# Patient Record
Sex: Female | Born: 1980
Health system: Southern US, Community
[De-identification: ages and names within clinical notes are randomized; demographics above are authoritative.]

## PROBLEM LIST (undated history)

## (undated) DIAGNOSIS — Z8774 Personal history of (corrected) congenital malformations of heart and circulatory system: Secondary | ICD-10-CM

## (undated) DIAGNOSIS — K5900 Constipation, unspecified: Secondary | ICD-10-CM

## (undated) HISTORY — PX: APPENDECTOMY: SHX54

---

## 2012-07-14 ENCOUNTER — Encounter (HOSPITAL_BASED_OUTPATIENT_CLINIC_OR_DEPARTMENT_OTHER): Payer: Self-pay

## 2012-07-14 ENCOUNTER — Emergency Department (HOSPITAL_BASED_OUTPATIENT_CLINIC_OR_DEPARTMENT_OTHER)
Admission: EM | Admit: 2012-07-14 | Discharge: 2012-07-14 | Disposition: A | Discharge status: Home / Self Care | Payer: Self-pay | Attending: Emergency Medicine | Admitting: Emergency Medicine

## 2012-07-14 DIAGNOSIS — Y939 Activity, unspecified: Secondary | ICD-10-CM | POA: Insufficient documentation

## 2012-07-14 DIAGNOSIS — F17200 Nicotine dependence, unspecified, uncomplicated: Secondary | ICD-10-CM | POA: Insufficient documentation

## 2012-07-14 DIAGNOSIS — Y929 Unspecified place or not applicable: Secondary | ICD-10-CM | POA: Insufficient documentation

## 2012-07-14 DIAGNOSIS — S058X9A Other injuries of unspecified eye and orbit, initial encounter: Secondary | ICD-10-CM | POA: Insufficient documentation

## 2012-07-14 DIAGNOSIS — S0501XA Injury of conjunctiva and corneal abrasion without foreign body, right eye, initial encounter: Secondary | ICD-10-CM

## 2012-07-14 DIAGNOSIS — W64XXXA Exposure to other animate mechanical forces, initial encounter: Secondary | ICD-10-CM | POA: Insufficient documentation

## 2012-07-14 MED ORDER — TOBRAMYCIN 0.3 % OP SOLN
2.0000 [drp] | OPHTHALMIC | Status: DC
Start: 2012-07-14 — End: 2012-07-14
  Administered 2012-07-14: 2 [drp] via OPHTHALMIC
  Filled 2012-07-14: qty 5

## 2012-07-14 MED ORDER — ACETAMINOPHEN-CODEINE 120-12 MG/5ML PO SUSP: 10.0000 mL | Freq: Four times a day (QID) | ORAL | Status: DC | PRN

## 2012-07-14 MED ORDER — FLUORESCEIN SODIUM 1 MG OP STRP
ORAL_STRIP | OPHTHALMIC | Status: AC
  Administered 2012-07-14: 12:00:00
  Filled 2012-07-14: qty 1

## 2012-07-14 MED ORDER — TETRACAINE HCL 0.5 % OP SOLN
OPHTHALMIC | Status: AC
  Administered 2012-07-14: 12:00:00
  Filled 2012-07-14: qty 2

## 2012-07-14 NOTE — ED Notes (Signed)
Pt reports her dog scratch her right eye yesterday.  She now has swelling and pain in affected area.

## 2012-07-14 NOTE — ED Provider Notes (Signed)
History     CSN: [id number]  Arrival date & time 07/14/12  1054   First MD Initiated Contact with Patient 07/14/12 1202      Chief Complaint  Patient presents with  . Eye Injury    (Consider location/radiation/quality/duration/timing/severity/associated sxs/prior treatment) HPI Comments: Pt states that her dog scratched her right eye last night:pt denies any visual changes:pt states that she is just having redness and drainage and some discomfort:pt last tetanus WUJ:WJXB shots utd  Patient is a 32 y.o. female presenting with eye injury. The history is provided by the patient. No language interpreter was used.  Eye Injury This is a new problem. The current episode started yesterday. The problem occurs constantly. The problem has been unchanged. Pertinent negatives include no fever. Nothing aggravates the symptoms. She has tried nothing for the symptoms.    History reviewed. No pertinent past medical history.  Past Surgical History  Procedure Laterality Date  . Appendectomy      No family history on file.  History  Substance Use Topics  . Smoking status: Current Every Day Smoker -- 0.50 packs/day    Types: Cigarettes  . Smokeless tobacco: Not on file  . Alcohol Use: Yes     Comment: occasional    OB History   Grav Para Term Preterm Abortions TAB SAB Ect Mult Living                  Review of Systems  Constitutional: Negative for fever.  Respiratory: Negative.   Cardiovascular: Negative.     Allergies  Review of patient's allergies indicates no known allergies.  Home Medications  No current outpatient prescriptions on file.  BP 120/77  Pulse 59  Temp(Src) 97.9 F (36.6 C) (Oral)  Resp 16  Ht 5' (1.524 m)  Wt 130 lb (58.968 kg)  BMI 25.39 kg/m2  SpO2 100%  LMP 06/23/2012  Physical Exam  Nursing note and vitals reviewed. Constitutional: She is oriented to person, place, and time. She appears well-developed and well-nourished.  HENT:  Right Ear:  External ear normal.  Left Ear: External ear normal.  Eyes: EOM are normal. Pupils are equal, round, and reactive to light. Right conjunctiva is injected.  Slit lamp exam:      The right eye shows corneal abrasion and fluorescein uptake.    Abrasion without ulceration  Cardiovascular: Normal rate and regular rhythm.   Pulmonary/Chest: Effort normal and breath sounds normal.  Musculoskeletal: Normal range of motion.  Neurological: She is alert and oriented to person, place, and time.  Skin: Skin is warm and dry.    ED Course  Procedures (including critical care time)  Labs Reviewed - No data to display No results found.   1. Corneal abrasion, right, initial encounter       MDM  Discussed with pharmacist about good coverage:pt given tobra drops and something for pain and pt given optho referral        Teressa Lower, NP 07/14/12 1939

## 2012-07-15 NOTE — ED Provider Notes (Signed)
Medical screening examination/treatment/procedure(s) were performed by non-physician practitioner and as supervising physician I was immediately available for consultation/collaboration.   Gwyneth Sprout, MD 07/15/12 [phone]

## 2013-02-21 ENCOUNTER — Emergency Department (HOSPITAL_BASED_OUTPATIENT_CLINIC_OR_DEPARTMENT_OTHER)
Admission: EM | Admit: 2013-02-21 | Discharge: 2013-02-21 | Disposition: A | Discharge status: Home / Self Care | Payer: Self-pay | Attending: Emergency Medicine | Admitting: Emergency Medicine

## 2013-02-21 ENCOUNTER — Emergency Department (HOSPITAL_BASED_OUTPATIENT_CLINIC_OR_DEPARTMENT_OTHER): Payer: Self-pay

## 2013-02-21 ENCOUNTER — Encounter (HOSPITAL_BASED_OUTPATIENT_CLINIC_OR_DEPARTMENT_OTHER): Payer: Self-pay | Admitting: *Deleted

## 2013-02-21 DIAGNOSIS — R142 Eructation: Secondary | ICD-10-CM | POA: Insufficient documentation

## 2013-02-21 DIAGNOSIS — Z3202 Encounter for pregnancy test, result negative: Secondary | ICD-10-CM | POA: Insufficient documentation

## 2013-02-21 DIAGNOSIS — N9489 Other specified conditions associated with female genital organs and menstrual cycle: Secondary | ICD-10-CM | POA: Insufficient documentation

## 2013-02-21 DIAGNOSIS — K5900 Constipation, unspecified: Secondary | ICD-10-CM | POA: Insufficient documentation

## 2013-02-21 DIAGNOSIS — R141 Gas pain: Secondary | ICD-10-CM | POA: Insufficient documentation

## 2013-02-21 DIAGNOSIS — R197 Diarrhea, unspecified: Secondary | ICD-10-CM | POA: Insufficient documentation

## 2013-02-21 DIAGNOSIS — F17200 Nicotine dependence, unspecified, uncomplicated: Secondary | ICD-10-CM | POA: Insufficient documentation

## 2013-02-21 LAB — COMPREHENSIVE METABOLIC PANEL
ALT: 7 U/L (ref 0–35)
AST: 9 U/L (ref 0–37)
Albumin: 4.1 g/dL (ref 3.5–5.2)
Calcium: 9.3 mg/dL (ref 8.4–10.5)
Chloride: 104 mEq/L (ref 96–112)
Creatinine, Ser: 0.7 mg/dL (ref 0.50–1.10)
Potassium: 3.9 mEq/L (ref 3.5–5.1)

## 2013-02-21 LAB — CBC WITH DIFFERENTIAL/PLATELET
Eosinophils Absolute: 0.1 10*3/uL (ref 0.0–0.7)
Hemoglobin: 13.7 g/dL (ref 12.0–15.0)
Lymphs Abs: 1.9 10*3/uL (ref 0.7–4.0)
MCH: 30.4 pg (ref 26.0–34.0)
MCV: 87.6 fL (ref 78.0–100.0)
Monocytes Relative: 9 % (ref 3–12)
Neutrophils Relative %: 62 % (ref 43–77)
RBC: 4.5 MIL/uL (ref 3.87–5.11)

## 2013-02-21 LAB — URINALYSIS, ROUTINE W REFLEX MICROSCOPIC
Bilirubin Urine: NEGATIVE
Glucose, UA: NEGATIVE mg/dL
Ketones, ur: NEGATIVE mg/dL
pH: 7 (ref 5.0–8.0)

## 2013-02-21 MED ORDER — POLYETHYLENE GLYCOL 3350 17 GM/SCOOP PO POWD: 17.0000 g | Freq: Every day | ORAL | Status: AC

## 2013-02-21 NOTE — ED Notes (Signed)
Patient states she has a two week history of a change in her bowel habits.  States she has intermittent gone from constipation to soft stools.  For the last one week, she has had pain in the upper left quadrant of her abdomin and feels like her abdomin is firm.  States she feels full.  Denies nausea or vomiting.

## 2013-02-21 NOTE — ED Provider Notes (Signed)
CSN: [id number]     Arrival date & time 02/21/13  1203 History   First MD Initiated Contact with Patient 02/21/13 1237     Chief Complaint  Patient presents with  . Abdominal Pain   (Consider location/radiation/quality/duration/timing/severity/associated sxs/prior Treatment) HPI Comments: Danielle Potter is a 32 year old female who presents the emergency department with chief complaint of abdominal pain.  Over the past 3 months the patient has noticed a change in her stools.  She's had intermittent loose stools as well as constipation.  She's also noticed a sense of fullness in her belly and abdominal girth.  The patient has intermittently taken ex-lax to relieve her symptoms.  Over the past week she's noticed some constant pain in the left upper quadrant of her belly which is intermittent, colicky.  She is not made a bowel movement in the last 2 days.  Patient is also noted not taken anything to relieve the symptoms.  Her last menstrual period was the last week of September.  She has been having normal periods monthly.  Patient does not use any birth control because she was told that she has too much scarring to get pregnant from a previous appendectomy as a child.  The patient has been with in a monogamous sexual relationship with the same partner for the past 13 years.  Patient is a 31 y.o. female presenting with abdominal pain.  Abdominal Pain Associated symptoms: constipation and diarrhea   Associated symptoms: no chest pain, no chills, no dysuria, no fever, no hematuria, no nausea, no shortness of breath and no vomiting     History reviewed. No pertinent past medical history. Past Surgical History  Procedure Laterality Date  . Appendectomy     No family history on file. History  Substance Use Topics  . Smoking status: Current Every Day Smoker -- 0.50 packs/day    Types: Cigarettes  . Smokeless tobacco: Not on file  . Alcohol Use: Yes     Comment: occasional   OB History   Grav Para  Term Preterm Abortions TAB SAB Ect Mult Living                 Review of Systems  Constitutional: Negative for fever and chills.  HENT: Negative for trouble swallowing.   Respiratory: Negative for shortness of breath.   Cardiovascular: Negative for chest pain.  Gastrointestinal: Positive for abdominal pain, diarrhea, constipation and abdominal distention. Negative for nausea, vomiting and blood in stool.  Genitourinary: Negative for dysuria and hematuria.  Musculoskeletal: Negative for myalgias and arthralgias.  Skin: Negative for rash.  Neurological: Negative for numbness.  All other systems reviewed and are negative.    Allergies  Review of patient's allergies indicates no known allergies.  Home Medications   Current Outpatient Rx  Name  Route  Sig  Dispense  Refill  . acetaminophen-codeine 120-12 MG/5ML suspension   Oral   Take 10 mLs by mouth every 6 (six) hours as needed for pain.   100 mL   0    BP 140/68  Pulse 72  Temp(Src) 97.9 F (36.6 C) (Oral)  Resp 20  SpO2 100%  LMP 02/12/2013 Physical Exam  Constitutional: She is oriented to person, place, and time. She appears well-developed and well-nourished. No distress.  HENT:  Head: Normocephalic and atraumatic.  Eyes: Conjunctivae are normal. No scleral icterus.  Neck: Normal range of motion.  Cardiovascular: Normal rate, regular rhythm and normal heart sounds.  Exam reveals no gallop and no friction rub.  No murmur heard. Pulmonary/Chest: Effort normal and breath sounds normal. No respiratory distress.  Abdominal: Soft. Bowel sounds are normal. She exhibits mass (palpable, firm abdominal mass extending from the suprapubic region to the umbilicus). She exhibits no distension. There is tenderness (LUQ. Suprapubic). There is no guarding.  Neurological: She is alert and oriented to person, place, and time.  Skin: Skin is warm and dry. She is not diaphoretic.    ED Course  Procedures (including critical care  time) Labs Review Labs Reviewed  URINALYSIS, ROUTINE W REFLEX MICROSCOPIC  PREGNANCY, URINE  CBC WITH DIFFERENTIAL  COMPREHENSIVE METABOLIC PANEL  LIPASE, BLOOD  CA 125   Imaging Review US Transvaginal Non-ob  02/21/2013   CLINICAL DATA:  Left lower quadrant pain. Constipation and bloating.  EXAM: TRANSABDOMINAL AND TRANSVAGINAL ULTRASOUND OF PELVIS  TECHNIQUE: Both transabdominal and transvaginal ultrasound examinations of the pelvis were performed. Transabdominal technique was performed for global imaging of the pelvis including uterus, ovaries, adnexal regions, and pelvic cul-de-sac. It was necessary to proceed with endovaginal exam following the transabdominal exam to visualize the uterus, endometrium, ovaries, and adnexa.  COMPARISON:  None  FINDINGS: Uterus  Measurements: 8.9 x 3.7 x 3.4 cm. There is a 3.1 cm exophytic lesion at the time of the uterine fundus, most likely representing an exophytic fibroid.  Endometrium  Thickness: Normal. 14 mm in thickness. No focal abnormality visualized.  Right ovary  Measurements: 4.5 x 2.9 x 2.9 cm. Two simple appearing cysts on the right ovary, each measuring 2.7 cm in size.  Left ovary  Measurements: Not visualized. There is a large cystic mass in the left adnexa with internal septations as well as solid components, measuring 12.7 x 13.1 x 9.6 cm. The complex solid component measures 5.6 x 6.3 x 3.7 cm.  Other findings  Small amount of free fluid.  IMPRESSION: Large complex mixed solid and cystic left adnexal mass measuring 13.1 cm maximum diameter. 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  In   Electronically Signed   By: Geanie Cooley M.D.   On: 02/21/2013 15:44   US Pelvis Complete  02/21/2013   CLINICAL DATA:  Left lower quadrant pain. Constipation and bloating.  EXAM: TRANSABDOMINAL AND TRANSVAGINAL ULTRASOUND OF PELVIS  TECHNIQUE: Both transabdominal and transvaginal ultrasound examinations of the pelvis were performed. Transabdominal  technique was performed for global imaging of the pelvis including uterus, ovaries, adnexal regions, and pelvic cul-de-sac. It was necessary to proceed with endovaginal exam following the transabdominal exam to visualize the uterus, endometrium, ovaries, and adnexa.  COMPARISON:  None  FINDINGS: Uterus  Measurements: 8.9 x 3.7 x 3.4 cm. There is a 3.1 cm exophytic lesion at the time of the uterine fundus, most likely representing an exophytic fibroid.  Endometrium  Thickness: Normal. 14 mm in thickness. No focal abnormality visualized.  Right ovary  Measurements: 4.5 x 2.9 x 2.9 cm. Two simple appearing cysts on the right ovary, each measuring 2.7 cm in size.  Left ovary  Measurements: Not visualized. There is a large cystic mass in the left adnexa with internal septations as well as solid components, measuring 12.7 x 13.1 x 9.6 cm. The complex solid component measures 5.6 x 6.3 x 3.7 cm.  Other findings  Small amount of free fluid.  IMPRESSION: Large complex mixed solid and cystic left adnexal mass measuring 13.1 cm maximum diameter. 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  In   Electronically Signed   By: Geanie Cooley M.D.   On: 02/21/2013  15:44   Dg Abd Acute W/chest  02/21/2013   CLINICAL DATA:  Lower abdominal pain and cramping.  EXAM: ACUTE ABDOMEN SERIES (ABDOMEN 2 VIEW & CHEST 1 VIEW)  COMPARISON:  None.  FINDINGS: There is no evidence of dilated bowel loops or free intraperitoneal air. There is a tiny calcification in the left side of the pelvis which is most likely a phlebolith. Osseous structures are normal. Heart size and mediastinal contours are within normal limits. Both lungs are clear.  IMPRESSION: Negative abdominal radiographs.  No acute cardiopulmonary disease.   Electronically Signed   By: Geanie Cooley M.D.   On: 02/21/2013 13:43    MDM   1. Adnexal mass   2. Constipation    Patient with abdominal pain  And stool changes. Mass in the abdomen is consistent with Uterine fundus.  Suspect pregnancy. Will evaluate with labs.  I have canceled the imaging pending  preg test.   Patient labs without acute abnormality, however I am concerned with the mass in her abdomen and have ordered transvaginal US.  4:03 PM Patient with 13 cm left adnexal mass concerning for ovarian cancer. i have spoken with Dr. Scheryl Darter at [facility] who will ensure patient has close follow up.  Will draw CA 125  Here. Discharge with Miralax. Discussed the findings with the patient. I answered all questions to the best of my ability. The patient expresses her understanding and agrees with the plan of care. The patient appears reasonably screened and/or stabilized for discharge and I doubt any other medical condition or other [facility] requiring further screening, evaluation, or treatment in the ED at this time prior to discharge.    Arthor Captain, PA-C 02/21/13 2229

## 2013-02-22 NOTE — ED Provider Notes (Signed)
Medical screening examination/treatment/procedure(s) were performed by non-physician practitioner and as supervising physician I was immediately available for consultation/collaboration.  Teiara Baria, MD 02/22/13 1444 

## 2013-02-23 LAB — CA 125: CA 125: 10.5 U/mL (ref 0.0–30.2)

## 2013-03-30 ENCOUNTER — Encounter: Payer: Self-pay | Admitting: Obstetrics & Gynecology

## 2013-03-30 ENCOUNTER — Ambulatory Visit (INDEPENDENT_AMBULATORY_CARE_PROVIDER_SITE_OTHER): Payer: Self-pay | Admitting: Obstetrics & Gynecology

## 2013-03-30 VITALS — BP 117/76 | HR 74 | Ht 60.0 in | Wt 148.9 lb

## 2013-03-30 DIAGNOSIS — N83209 Unspecified ovarian cyst, unspecified side: Secondary | ICD-10-CM

## 2013-03-30 DIAGNOSIS — N83202 Unspecified ovarian cyst, left side: Secondary | ICD-10-CM

## 2013-03-30 NOTE — Progress Notes (Signed)
Subjective:     Patient ID: Danielle Potter, female   DOB: Jan 02, 1981, 32 y.o.   MRN: [id number]  HPI Pt is a 32 yo G0 who presents as f/u from the ED. She presents with c/o 2 months of pelvic pain.  When she went to the ED it had become severe.  The pain is intermittent in intensity.  She has been able to do work.  She denies weight loss.  Pt reports that her tubes are blocked so the is infertile and she is ok with that.   History reviewed. No pertinent past medical history.  Past Surgical History  Procedure Laterality Date  . Appendectomy     No Known Allergies  Current Outpatient Prescriptions on File Prior to Visit  Medication Sig Dispense Refill  . polyethylene glycol powder (GLYCOLAX/MIRALAX) powder Take 17 g by mouth daily.  255 g  0  . acetaminophen-codeine 120-12 MG/5ML suspension Take 10 mLs by mouth every 6 (six) hours as needed for pain.  100 mL  0   No current facility-administered medications on file prior to visit.   History   Social History  . Marital Status: Single    Spouse Name: N/A    Number of Children: N/A  . Years of Education: N/A   Occupational History  . Not on file.   Social History Main Topics  . Smoking status: Current Every Day Smoker -- 0.50 packs/day    Types: Cigarettes  . Smokeless tobacco: Not on file  . Alcohol Use: Yes     Comment: occasional  . Drug Use: No  . Sexual Activity: Not on file   Other Topics Concern  . Not on file   Social History Narrative  . No narrative on file    Review of Systems     Objective:   Physical Exam BP 117/76  Pulse 74  Ht 5' (1.524 m)  Wt 148 lb 14.4 oz (67.541 kg)  BMI 29.08 kg/m2  LMP 03/29/2013 Pt in NAD Lungs: CTA Abd: soft, NT, ND  GU: EGBUS: no lesions Vagina: no blood in vault Cervix: no lesion; no mucopurulent d/c Uterus: small, mobile Adnexa: large mass- mobile; feels 12cm; tender to palpation        02/21/2013 EXAM:  TRANSABDOMINAL AND TRANSVAGINAL ULTRASOUND OF PELVIS   TECHNIQUE:  Both transabdominal and transvaginal ultrasound examinations of the  pelvis were performed. Transabdominal technique was performed for  global imaging of the pelvis including uterus, ovaries, adnexal  regions, and pelvic cul-de-sac. It was necessary to proceed with  endovaginal exam following the transabdominal exam to visualize the  uterus, endometrium, ovaries, and adnexa.  COMPARISON: None  FINDINGS:  Uterus  Measurements: 8.9 x 3.7 x 3.4 cm. There is a 3.1 cm exophytic lesion  at the time of the uterine fundus, most likely representing an  exophytic fibroid.  Endometrium  Thickness: Normal. 14 mm in thickness. No focal abnormality  visualized.  Right ovary  Measurements: 4.5 x 2.9 x 2.9 cm. Two simple appearing cysts on the  right ovary, each measuring 2.7 cm in size.  Left ovary  Measurements: Not visualized. There is a large cystic mass in the  left adnexa with internal septations as well as solid components,  measuring 12.7 x 13.1 x 9.6 cm. The complex solid component measures  5.6 x 6.3 x 3.7 cm.  Other findings  Small amount of free fluid.  IMPRESSION:  Large complex mixed solid and cystic left adnexal mass measuring  13.1 cm  maximum diameter. 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      Assessment:     Mass on left adnexa- d/w pt the need for surgical evaluation.  Will get CA125 and CT. WiIl have pt f/u in 1-2 weeks to determine the type of surgery      Uterine fibroids- asymptomatic    Plan:     Pelvic CT- with and without contrast F/u 2 weeks CA125

## 2013-03-30 NOTE — Patient Instructions (Signed)
Ovarian Cyst  The ovaries are small organs that are on each side of the uterus. The ovaries are the organs that produce the female hormones, estrogen and progesterone. An ovarian cyst is a sac filled with fluid that can vary in its size. It is normal for a small cyst to form in women who are in the childbearing age and who have menstrual periods. This type of cyst is called a follicle cyst that becomes an ovulation cyst (corpus luteum cyst) after it produces the women's egg. It later goes away on its own if the woman does not become pregnant. There are other kinds of ovarian cysts that may cause problems and may need to be treated. The most serious problem is a cyst with cancer. It should be noted that menopausal women who have an ovarian cyst are at a higher risk of it being a cancer cyst. They should be evaluated very quickly, thoroughly and followed closely. This is especially true in menopausal women because of the high rate of ovarian cancer in women in menopause.  CAUSES AND TYPES OF OVARIAN CYSTS:   FUNCTIONAL CYST: The follicle/corpus luteum cyst is a functional cyst that occurs every month during ovulation with the menstrual cycle. They go away with the next menstrual cycle if the woman does not get pregnant. Usually, there are no symptoms with a functional cyst.   ENDOMETRIOMA CYST: This cyst develops from the lining of the uterus tissue. This cyst gets in or on the ovary. It grows every month from the bleeding during the menstrual period. It is also called a "chocolate cyst" because it becomes filled with blood that turns brown. This cyst can cause pain in the lower abdomen during intercourse and with your menstrual period.   CYSTADENOMA CYST: This cyst develops from the cells on the outside of the ovary. They usually are not cancerous. They can get very big and cause lower abdomen pain and pain with intercourse. This type of cyst can twist on itself, cut off its blood supply and cause severe pain. It  also can easily rupture and cause a lot of pain.   DERMOID CYST: This type of cyst is sometimes found in both ovaries. They are found to have different kinds of body tissue in the cyst. The tissue includes skin, teeth, hair, and/or cartilage. They usually do not have symptoms unless they get very big. Dermoid cysts are rarely cancerous.   POLYCYSTIC OVARY: This is a rare condition with hormone problems that produces many small cysts on both ovaries. The cysts are follicle-like cysts that never produce an egg and become a corpus luteum. It can cause an increase in body weight, infertility, acne, increase in body and facial hair and lack of menstrual periods or rare menstrual periods. Many women with this problem develop type 2 diabetes. The exact cause of this problem is unknown. A polycystic ovary is rarely cancerous.   THECA LUTEIN CYST: Occurs when too much hormone (human chorionic gonadotropin) is produced and over-stimulates the ovaries to produce an egg. They are frequently seen when doctors stimulate the ovaries for invitro-fertilization (test tube babies).   LUTEOMA CYST: This cyst is seen during pregnancy. Rarely it can cause an obstruction to the birth canal during labor and delivery. They usually go away after delivery.  SYMPTOMS    Pelvic pain or pressure.   Pain during sexual intercourse.   Increasing girth (swelling) of the abdomen.   Abnormal menstrual periods.   Increasing pain with menstrual periods.     You stop having menstrual periods and you are not pregnant.  DIAGNOSIS   The diagnosis can be made during:   Routine or annual pelvic examination (common).   Ultrasound.   X-ray of the pelvis.   CT Scan.   MRI.   Blood tests.  TREATMENT    Treatment may only be to follow the cyst monthly for 2 to 3 months with your caregiver. Many go away on their own, especially functional cysts.   May be aspirated (drained) with a long needle with ultrasound, or by laparoscopy (inserting a tube into  the pelvis through a small incision).   The whole cyst can be removed by laparoscopy.   Sometimes the cyst may need to be removed through an incision in the lower abdomen.   Hormone treatment is sometimes used to help dissolve certain cysts.   Birth control pills are sometimes used to help dissolve certain cysts.  HOME CARE INSTRUCTIONS   Follow your caregiver's advice regarding:   Medicine.   Follow up visits to evaluate and treat the cyst.   You may need to come back or make an appointment with another caregiver, to find the exact cause of your cyst, if your caregiver is not a gynecologist.   Get your yearly and recommended pelvic examinations and Pap tests.   Let your caregiver know if you have had an ovarian cyst in the past.  SEEK MEDICAL CARE IF:    Your periods are late, irregular, they stop, or are painful.   Your stomach (abdomen) or pelvic pain does not go away.   Your stomach becomes larger or swollen.   You have pressure on your bladder or trouble emptying your bladder completely.   You have painful sexual intercourse.   You have feelings of fullness, pressure, or discomfort in your stomach.   You lose weight for no apparent reason.   You feel generally ill.   You become constipated.   You lose your appetite.   You develop acne.   You have an increase in body and facial hair.   You are gaining weight, without changing your exercise and eating habits.   You think you are pregnant.  SEEK IMMEDIATE MEDICAL CARE IF:    You have increasing abdominal pain.   You feel sick to your stomach (nausea) and/or vomit.   You develop a fever that comes on suddenly.   You develop abdominal pain during a bowel movement.   Your menstrual periods become heavier than usual.  Document Released: 05/04/2005 Document Revised: 07/27/2011 Document Reviewed: 03/07/2009  [employer] Patient Information 2014 [employer].

## 2013-03-31 ENCOUNTER — Ambulatory Visit (HOSPITAL_COMMUNITY)
Admission: RE | Admit: 2013-03-31 | Discharge: 2013-03-31 | Disposition: A | Discharge status: Home / Self Care | Payer: Self-pay | Source: Ambulatory Visit | Attending: Obstetrics & Gynecology | Admitting: Obstetrics & Gynecology

## 2013-03-31 ENCOUNTER — Telehealth: Payer: Self-pay | Admitting: Obstetrics & Gynecology

## 2013-03-31 ENCOUNTER — Other Ambulatory Visit: Payer: Self-pay | Admitting: Obstetrics & Gynecology

## 2013-03-31 DIAGNOSIS — N9489 Other specified conditions associated with female genital organs and menstrual cycle: Secondary | ICD-10-CM | POA: Insufficient documentation

## 2013-03-31 DIAGNOSIS — N854 Malposition of uterus: Secondary | ICD-10-CM | POA: Insufficient documentation

## 2013-03-31 DIAGNOSIS — N83202 Unspecified ovarian cyst, left side: Secondary | ICD-10-CM

## 2013-03-31 DIAGNOSIS — N83209 Unspecified ovarian cyst, unspecified side: Secondary | ICD-10-CM | POA: Insufficient documentation

## 2013-03-31 LAB — CA 125: CA 125: 7.9 U/mL (ref 0.0–30.2)

## 2013-03-31 MED ORDER — IOHEXOL 300 MG/ML  SOLN
100.0000 mL | Freq: Once | INTRAMUSCULAR | Status: AC | PRN
  Administered 2013-03-31: 100 mL via INTRAVENOUS

## 2013-03-31 NOTE — Telephone Encounter (Signed)
Pt called to discuss her CA125 and her CT results.  She has opted for a LSO via laparotomy with removal of the pedunculated fibroid.  Will post the case. Pt has appt early Dec will review poreop instructions at that time.  Bob Eastwood L. Harraway-Smith, M.D., [place]

## 2013-04-19 ENCOUNTER — Encounter: Payer: Self-pay | Admitting: Obstetrics & Gynecology

## 2013-04-19 ENCOUNTER — Ambulatory Visit (INDEPENDENT_AMBULATORY_CARE_PROVIDER_SITE_OTHER): Payer: Self-pay | Admitting: Obstetrics & Gynecology

## 2013-04-19 VITALS — BP 123/80 | HR 83 | Temp 97.0°F | Ht 60.0 in | Wt 154.4 lb

## 2013-04-19 DIAGNOSIS — N83209 Unspecified ovarian cyst, unspecified side: Secondary | ICD-10-CM

## 2013-04-19 NOTE — Patient Instructions (Signed)
Laparotomy °Care After °Refer to this sheet in the next few weeks. These instructions provide you with information on caring for yourself after your procedure. Your caregiver may also give you more specific instructions. Your treatment has been planned according to current medical practices, but problems sometimes occur. Call your caregiver if you have any problems or questions after your procedure. °HOME CARE INSTRUCTIONS °ACTIVITY °· Rest as much as possible the first two weeks at home. °· Avoid strenuous activity such as heavy lifting (more than 10 pounds), pushing, or pulling. Limit stair climbing to once or twice a day for the first week, then slowly increase this activity. °· Take frequent rest periods throughout the day. °· Talk with your caregiver about when you may resume your usual physical activity. °· You need to be out of bed and walking as much as possible. This decreases the chance of: °· Blood clots. °· Pneumonia. °NUTRITION °· You can resume your normal diet once you regain bowel function. °· Drink plenty of fluids (6 8 glasses a day or as instructed by your caregiver). °· Eat a well-balanced diet. °· Daily portions of food from the meat (protein), milk, vegetable, and bread groups are necessary for your health. °ELIMINATION °It is very important not to strain during bowel movements. If constipation should occur, you may: °· Take a mild laxative. °· Add fruit and bran to your diet. °· Drink more fluids. °HYGIENE °· Take showers, not baths, until 4 6 weeks after surgery. °· If your incision is closed, you may take a shower or tub bath. °FEVER °If you feel feverish or have shaking chills, take your temperature. If it is 102° F (38.9° C), call your caregiver. The fever may mean there is an infection. °PAIN CONTROL °· Mild discomfort may occur. °· Only take over-the-counter or prescription medicines for pain, discomfort, or fever as directed by your caregiver. Take any prescribed medicines exactly as  directed. °INCISION CARE °· Keep your incision site clean with soap and water. °· Do not use a dressing unless your cut (incision) from surgery is draining or irritated. °· If you have small adhesive strips in place and they do not fall off within 10 days, carefully peel them off. °· Check your incision and surrounding area daily for any redness, swelling, discoloration, heavy drainage, or separation of the skin. °SEXUAL INTERCOURSE °Do not have sexual intercourse until after your follow-up appointment, unless your caregiver tells you otherwise. °SEEK MEDICAL CARE IF:  °· You are unable to tolerate food or drinks. °· You are unable to pass gas or have a bowel movement. °· Your pain becomes more severe or is not relieved with medicines. °· You have redness, swelling, discoloration, heavy drainage, or separation of the skin at the incision site. °Document Released: 12/17/2003 Document Revised: 04/20/2012 Document Reviewed: 05/03/2007 °ExitCare® Patient Information ©2014 [employer]. ° °

## 2013-04-19 NOTE — Progress Notes (Signed)
Patient ID: Danielle Potter, female   DOB: 11-Mar-1981, 32 y.o.   MRN: [id number] Patient desires surgical management with laparotomy with left oophorectomy and myomectomy.  The risks of surgery were discussed in detail with the patient including but not limited to: bleeding which may require transfusion or reoperation; infection which may require prolonged hospitalization or re-hospitalization and antibiotic therapy; injury to bowel, bladder, ureters and major vessels or other surrounding organs; need for additional procedures including laparotomy; thromboembolic phenomenon, incisional problems and other postoperative or anesthesia complications.  Patient was told that the likelihood that her condition and symptoms will be treated effectively with this surgical management was very high; the postoperative expectations were also discussed in detail. The patient also understands the alternative treatment options which were discussed in full. All questions were answered.  Routine preoperative instructions of having nothing to eat or drink after midnight on the day prior to surgery and also coming to the hospital 1 1/2 hours prior to her time of surgery were also emphasized.  She was told she will be called for a preoperative appointment about a week prior to surgery and will be given further preoperative instructions at that visit. Printed patient education handouts about the procedure were given to the patient to review at home.

## 2013-05-04 ENCOUNTER — Encounter (HOSPITAL_COMMUNITY): Payer: Self-pay

## 2013-05-16 ENCOUNTER — Inpatient Hospital Stay (HOSPITAL_COMMUNITY): Payer: Self-pay | Admitting: Anesthesiology

## 2013-05-16 ENCOUNTER — Encounter (HOSPITAL_COMMUNITY): Payer: Self-pay | Admitting: Anesthesiology

## 2013-05-16 ENCOUNTER — Inpatient Hospital Stay (HOSPITAL_COMMUNITY)
Admission: RE | Admit: 2013-05-16 | Discharge: 2013-05-17 | DRG: 743 | Disposition: A | Discharge status: Home / Self Care | Payer: Self-pay | Source: Ambulatory Visit | Attending: Obstetrics & Gynecology | Admitting: Obstetrics & Gynecology

## 2013-05-16 ENCOUNTER — Encounter (HOSPITAL_COMMUNITY): Payer: Self-pay | Admitting: *Deleted

## 2013-05-16 ENCOUNTER — Encounter (HOSPITAL_COMMUNITY)
Admission: RE | Disposition: A | Discharge status: Home / Self Care | Payer: Self-pay | Source: Ambulatory Visit | Attending: Obstetrics & Gynecology

## 2013-05-16 DIAGNOSIS — N854 Malposition of uterus: Secondary | ICD-10-CM | POA: Diagnosis present

## 2013-05-16 DIAGNOSIS — N736 Female pelvic peritoneal adhesions (postinfective): Secondary | ICD-10-CM

## 2013-05-16 DIAGNOSIS — R102 Pelvic and perineal pain: Secondary | ICD-10-CM

## 2013-05-16 DIAGNOSIS — N83209 Unspecified ovarian cyst, unspecified side: Principal | ICD-10-CM

## 2013-05-16 DIAGNOSIS — F17200 Nicotine dependence, unspecified, uncomplicated: Secondary | ICD-10-CM | POA: Diagnosis present

## 2013-05-16 DIAGNOSIS — N949 Unspecified condition associated with female genital organs and menstrual cycle: Secondary | ICD-10-CM | POA: Diagnosis present

## 2013-05-16 DIAGNOSIS — Z9889 Other specified postprocedural states: Secondary | ICD-10-CM

## 2013-05-16 HISTORY — PX: SALPINGOOPHORECTOMY: SHX82

## 2013-05-16 HISTORY — PX: LAPAROTOMY: SHX154

## 2013-05-16 HISTORY — DX: Personal history of (corrected) congenital malformations of heart and circulatory system: Z87.74

## 2013-05-16 HISTORY — DX: Constipation, unspecified: K59.00

## 2013-05-16 LAB — ABO/RH: ABO/RH(D): A POS

## 2013-05-16 LAB — PREGNANCY, URINE: Preg Test, Ur: NEGATIVE

## 2013-05-16 LAB — CBC
HCT: 39.1 % (ref 36.0–46.0)
Hemoglobin: 13.6 g/dL (ref 12.0–15.0)
MCH: 30.2 pg (ref 26.0–34.0)
MCV: 86.7 fL (ref 78.0–100.0)
Platelets: 253 10*3/uL (ref 150–400)
RBC: 4.51 MIL/uL (ref 3.87–5.11)
WBC: 8.2 10*3/uL (ref 4.0–10.5)

## 2013-05-16 LAB — TYPE AND SCREEN

## 2013-05-16 SURGERY — SALPINGO-OOPHORECTOMY, OPEN
Anesthesia: General | Site: Abdomen

## 2013-05-16 MED ORDER — DEXTROSE-NACL 5-0.45 % IV SOLN
INTRAVENOUS | Status: DC
  Administered 2013-05-16: 21:00:00 via INTRAVENOUS

## 2013-05-16 MED ORDER — HYDROMORPHONE HCL PF 1 MG/ML IJ SOLN
0.2500 mg | INTRAMUSCULAR | Status: DC | PRN
  Administered 2013-05-16 (×2): 0.5 mg via INTRAVENOUS

## 2013-05-16 MED ORDER — PROPOFOL 10 MG/ML IV BOLUS
INTRAVENOUS | Status: DC | PRN
  Administered 2013-05-16: 200 mg via INTRAVENOUS

## 2013-05-16 MED ORDER — SODIUM CHLORIDE 0.9 % IJ SOLN: 9.0000 mL | INTRAMUSCULAR | Status: DC | PRN

## 2013-05-16 MED ORDER — ONDANSETRON HCL 4 MG/2ML IJ SOLN
INTRAMUSCULAR | Status: AC
  Filled 2013-05-16: qty 2

## 2013-05-16 MED ORDER — ROCURONIUM BROMIDE 100 MG/10ML IV SOLN
INTRAVENOUS | Status: AC
  Filled 2013-05-16: qty 1

## 2013-05-16 MED ORDER — KETOROLAC TROMETHAMINE 30 MG/ML IJ SOLN
30.0000 mg | Freq: Once | INTRAMUSCULAR | Status: AC
  Administered 2013-05-16: 30 mg via INTRAVENOUS

## 2013-05-16 MED ORDER — DIPHENHYDRAMINE HCL 50 MG/ML IJ SOLN: 12.5000 mg | Freq: Four times a day (QID) | INTRAMUSCULAR | Status: DC | PRN

## 2013-05-16 MED ORDER — NEOSTIGMINE METHYLSULFATE 1 MG/ML IJ SOLN
INTRAMUSCULAR | Status: DC | PRN
  Administered 2013-05-16: 3 mg via INTRAVENOUS

## 2013-05-16 MED ORDER — HYDROMORPHONE HCL PF 1 MG/ML IJ SOLN
INTRAMUSCULAR | Status: AC
  Filled 2013-05-16: qty 1

## 2013-05-16 MED ORDER — ACETAMINOPHEN 160 MG/5ML PO SOLN
ORAL | Status: AC
  Administered 2013-05-16: 975 mg via ORAL
  Filled 2013-05-16: qty 20.3

## 2013-05-16 MED ORDER — FENTANYL CITRATE 0.05 MG/ML IJ SOLN
INTRAMUSCULAR | Status: DC | PRN
  Administered 2013-05-16: 100 ug via INTRAVENOUS
  Administered 2013-05-16: 150 ug via INTRAVENOUS

## 2013-05-16 MED ORDER — OXYCODONE-ACETAMINOPHEN 5-325 MG PO TABS: 1.0000 | ORAL_TABLET | ORAL | Status: DC | PRN

## 2013-05-16 MED ORDER — CEFAZOLIN SODIUM-DEXTROSE 2-3 GM-% IV SOLR: 2.0000 g | INTRAVENOUS | Status: DC

## 2013-05-16 MED ORDER — LIDOCAINE HCL (CARDIAC) 20 MG/ML IV SOLN
INTRAVENOUS | Status: AC
  Filled 2013-05-16: qty 5

## 2013-05-16 MED ORDER — MIDAZOLAM HCL 2 MG/2ML IJ SOLN
INTRAMUSCULAR | Status: DC | PRN
  Administered 2013-05-16: 2 mg via INTRAVENOUS

## 2013-05-16 MED ORDER — SIMETHICONE 80 MG PO CHEW: 80.0000 mg | CHEWABLE_TABLET | Freq: Four times a day (QID) | ORAL | Status: DC | PRN

## 2013-05-16 MED ORDER — BUPIVACAINE HCL (PF) 0.25 % IJ SOLN
INTRAMUSCULAR | Status: AC
  Filled 2013-05-16: qty 30

## 2013-05-16 MED ORDER — LACTATED RINGERS IV SOLN
INTRAVENOUS | Status: DC
  Administered 2013-05-16: 14:00:00 via INTRAVENOUS

## 2013-05-16 MED ORDER — PROPOFOL 10 MG/ML IV EMUL
INTRAVENOUS | Status: AC
  Filled 2013-05-16: qty 20

## 2013-05-16 MED ORDER — MICROFIBRILLAR COLL HEMOSTAT EX POWD
CUTANEOUS | Status: AC
  Filled 2013-05-16: qty 5

## 2013-05-16 MED ORDER — KETOROLAC TROMETHAMINE 30 MG/ML IJ SOLN: 30.0000 mg | Freq: Four times a day (QID) | INTRAMUSCULAR | Status: DC

## 2013-05-16 MED ORDER — LACTATED RINGERS IV SOLN: INTRAVENOUS | Status: DC

## 2013-05-16 MED ORDER — HYDROMORPHONE 0.3 MG/ML IV SOLN
INTRAVENOUS | Status: DC
  Administered 2013-05-16: 0.2 mg via INTRAVENOUS
  Administered 2013-05-16: 19:00:00 via INTRAVENOUS
  Administered 2013-05-17: 0.339 mg via INTRAVENOUS
  Administered 2013-05-17: 0.4 mg via INTRAVENOUS
  Filled 2013-05-16: qty 25

## 2013-05-16 MED ORDER — LACTATED RINGERS IV SOLN
INTRAVENOUS | Status: DC
  Administered 2013-05-16 (×2): via INTRAVENOUS

## 2013-05-16 MED ORDER — GLYCOPYRROLATE 0.2 MG/ML IJ SOLN
INTRAMUSCULAR | Status: DC | PRN
  Administered 2013-05-16: 0.6 mg via INTRAVENOUS

## 2013-05-16 MED ORDER — SODIUM CHLORIDE 0.9 % IJ SOLN
INTRAMUSCULAR | Status: AC
  Filled 2013-05-16: qty 50

## 2013-05-16 MED ORDER — ONDANSETRON HCL 4 MG/2ML IJ SOLN
4.0000 mg | Freq: Four times a day (QID) | INTRAMUSCULAR | Status: DC | PRN
  Filled 2013-05-16: qty 2

## 2013-05-16 MED ORDER — KETOROLAC TROMETHAMINE 30 MG/ML IJ SOLN
INTRAMUSCULAR | Status: AC
  Filled 2013-05-16: qty 1

## 2013-05-16 MED ORDER — DIPHENHYDRAMINE HCL 12.5 MG/5ML PO ELIX: 12.5000 mg | ORAL_SOLUTION | Freq: Four times a day (QID) | ORAL | Status: DC | PRN

## 2013-05-16 MED ORDER — LIDOCAINE HCL (CARDIAC) 20 MG/ML IV SOLN
INTRAVENOUS | Status: DC | PRN
  Administered 2013-05-16: 50 mg via INTRAVENOUS

## 2013-05-16 MED ORDER — ONDANSETRON HCL 4 MG/2ML IJ SOLN
4.0000 mg | Freq: Four times a day (QID) | INTRAMUSCULAR | Status: DC | PRN
  Administered 2013-05-16: 4 mg via INTRAVENOUS

## 2013-05-16 MED ORDER — NALOXONE HCL 0.4 MG/ML IJ SOLN: 0.4000 mg | INTRAMUSCULAR | Status: DC | PRN

## 2013-05-16 MED ORDER — ROCURONIUM BROMIDE 100 MG/10ML IV SOLN
INTRAVENOUS | Status: DC | PRN
  Administered 2013-05-16: 50 mg via INTRAVENOUS

## 2013-05-16 MED ORDER — SODIUM CHLORIDE 0.9 % IJ SOLN
INTRAMUSCULAR | Status: AC
  Filled 2013-05-16: qty 10

## 2013-05-16 MED ORDER — HYDROMORPHONE HCL PF 1 MG/ML IJ SOLN
INTRAMUSCULAR | Status: DC | PRN
  Administered 2013-05-16: 1 mg via INTRAVENOUS

## 2013-05-16 MED ORDER — IBUPROFEN 600 MG PO TABS: 600.0000 mg | ORAL_TABLET | Freq: Four times a day (QID) | ORAL | Status: DC | PRN

## 2013-05-16 MED ORDER — CEFAZOLIN SODIUM-DEXTROSE 2-3 GM-% IV SOLR
2.0000 g | INTRAVENOUS | Status: AC
  Administered 2013-05-16: 2 g via INTRAVENOUS

## 2013-05-16 MED ORDER — BUPIVACAINE LIPOSOME 1.3 % IJ SUSP
20.0000 mL | Freq: Once | INTRAMUSCULAR | Status: AC
Start: 2013-05-16 — End: 2013-05-16
  Administered 2013-05-16: 20 mL
  Filled 2013-05-16: qty 20

## 2013-05-16 MED ORDER — MIDAZOLAM HCL 2 MG/2ML IJ SOLN
INTRAMUSCULAR | Status: AC
  Filled 2013-05-16: qty 2

## 2013-05-16 MED ORDER — ZOLPIDEM TARTRATE 5 MG PO TABS: 5.0000 mg | ORAL_TABLET | Freq: Every evening | ORAL | Status: DC | PRN

## 2013-05-16 MED ORDER — PANTOPRAZOLE SODIUM 40 MG IV SOLR
40.0000 mg | Freq: Every day | INTRAVENOUS | Status: DC
  Administered 2013-05-16: 40 mg via INTRAVENOUS
  Filled 2013-05-16 (×2): qty 40

## 2013-05-16 MED ORDER — ONDANSETRON HCL 4 MG/2ML IJ SOLN
INTRAMUSCULAR | Status: DC | PRN
  Administered 2013-05-16: 4 mg via INTRAVENOUS

## 2013-05-16 MED ORDER — KETOROLAC TROMETHAMINE 30 MG/ML IJ SOLN: 15.0000 mg | Freq: Once | INTRAMUSCULAR | Status: DC | PRN

## 2013-05-16 MED ORDER — MEPERIDINE HCL 25 MG/ML IJ SOLN: 6.2500 mg | INTRAMUSCULAR | Status: DC | PRN

## 2013-05-16 MED ORDER — ACETAMINOPHEN 160 MG/5ML PO SOLN
975.0000 mg | Freq: Once | ORAL | Status: AC
  Administered 2013-05-16: 975 mg via ORAL

## 2013-05-16 MED ORDER — METOCLOPRAMIDE HCL 5 MG/ML IJ SOLN: 10.0000 mg | Freq: Once | INTRAMUSCULAR | Status: DC | PRN

## 2013-05-16 MED ORDER — VASOPRESSIN 20 UNIT/ML IJ SOLN
INTRAMUSCULAR | Status: AC
  Filled 2013-05-16: qty 1

## 2013-05-16 MED ORDER — FENTANYL CITRATE 0.05 MG/ML IJ SOLN
INTRAMUSCULAR | Status: AC
  Filled 2013-05-16: qty 5

## 2013-05-16 MED ORDER — ONDANSETRON HCL 4 MG PO TABS
4.0000 mg | ORAL_TABLET | Freq: Four times a day (QID) | ORAL | Status: DC | PRN
  Administered 2013-05-17: 4 mg via ORAL
  Filled 2013-05-16: qty 1

## 2013-05-16 SURGICAL SUPPLY — 29 items
CABLE HIGH FREQUENCY MONO STRZ (ELECTRODE) IMPLANT
CATH ROBINSON RED A/P 16FR (CATHETERS) IMPLANT
CHLORAPREP W/TINT 26ML (MISCELLANEOUS) ×4 IMPLANT
CLOTH BEACON ORANGE TIMEOUT ST (SAFETY) ×4 IMPLANT
DRSG OPSITE POSTOP 4X10 (GAUZE/BANDAGES/DRESSINGS) ×4 IMPLANT
GLOVE BIO SURGEON STRL SZ7 (GLOVE) ×8 IMPLANT
GLOVE BIOGEL PI IND STRL 7.0 (GLOVE) ×6 IMPLANT
GLOVE BIOGEL PI INDICATOR 7.0 (GLOVE) ×2
GOWN PREVENTION PLUS LG XLONG (DISPOSABLE) ×8 IMPLANT
GOWN PREVENTION PLUS XLARGE (GOWN DISPOSABLE) ×8 IMPLANT
MANIPULATOR UTERINE 4.5 ZUMI (MISCELLANEOUS) ×4 IMPLANT
NEEDLE INSUFFLATION 120MM (ENDOMECHANICALS) ×4 IMPLANT
NS IRRIG 1000ML POUR BTL (IV SOLUTION) ×8 IMPLANT
PACK LAPAROSCOPY BASIN (CUSTOM PROCEDURE TRAY) ×4 IMPLANT
POUCH SPECIMEN RETRIEVAL 10MM (ENDOMECHANICALS) IMPLANT
PROTECTOR NERVE ULNAR (MISCELLANEOUS) ×4 IMPLANT
SCALPEL HARMONIC ACE (MISCELLANEOUS) IMPLANT
SET IRRIG TUBING LAPAROSCOPIC (IRRIGATION / IRRIGATOR) IMPLANT
SPONGE SURGIFOAM ABS GEL 12-7 (HEMOSTASIS) ×8 IMPLANT
SUT VIC AB 3-0 X1 27 (SUTURE) ×4 IMPLANT
SUT VICRYL 0 TIES 12 18 (SUTURE) ×4 IMPLANT
SUT VICRYL 0 UR6 27IN ABS (SUTURE) ×8 IMPLANT
SUT VICRYL 4-0 PS2 18IN ABS (SUTURE) ×4 IMPLANT
TOWEL OR 17X24 6PK STRL BLUE (TOWEL DISPOSABLE) ×8 IMPLANT
TRAY FOLEY CATH 14FR (SET/KITS/TRAYS/PACK) ×4 IMPLANT
TROCAR BALLN 12MMX100 BLUNT (TROCAR) IMPLANT
TROCAR OPTI TIP 5M 100M (ENDOMECHANICALS) IMPLANT
TROCAR XCEL DIL TIP R 11M (ENDOMECHANICALS) IMPLANT
WATER STERILE IRR 1000ML POUR (IV SOLUTION) ×4 IMPLANT

## 2013-05-16 NOTE — H&P (Signed)
HPI Pt is a 32 yo G0 who presents as f/u from the ED. She presents with c/o 2 months of pelvic pain. When she went to the ED it had become severe. The pain is intermittent in intensity. She has been able to do work. She denies weight loss. Pt reports that her tubes are blocked so the is infertile and she is ok with that.  She also reports a h/o fibroids wants it resected.    History reviewed. No pertinent past medical history.  Past Surgical History   Procedure  Laterality  Date   .  Appendectomy     No Known Allergies  Current Outpatient Prescriptions on File Prior to Visit   Medication  Sig  Dispense  Refill   .  polyethylene glycol powder (GLYCOLAX/MIRALAX) powder  Take 17 g by mouth daily.  255 g  0   .  acetaminophen-codeine 120-12 MG/5ML suspension  Take 10 mLs by mouth every 6 (six) hours as needed for pain.  100 mL  0    No current facility-administered medications on file prior to visit.    History    Social History   .  Marital Status:  Single     Spouse Name:  N/A     Number of Children:  N/A   .  Years of Education:  N/A    Occupational History   .  Not on file.    Social History Main Topics   .  Smoking status:  Current Every Day Smoker -- 0.50 packs/day     Types:  Cigarettes   .  Smokeless tobacco:  Not on file   .  Alcohol Use:  Yes      Comment: occasional   .  Drug Use:  No   .  Sexual Activity:  Not on file    Other Topics  Concern   .  Not on file    Social History Narrative   .  No narrative on file   Review of Systems  Objective:   Physical Exam BP 132/69  Pulse 77  Temp(Src) 97.9 F (36.6 C) (Oral)  Resp 20  Ht 5' (1.524 m)  Wt 141 lb (63.957 kg)  BMI 27.54 kg/m2  SpO2 100%  LMP 04/25/2013   Pt in NAD  Lungs: CTA  Abd: soft, NT, ND  GU:  EGBUS: no lesions  Vagina: no blood in vault  Cervix: no lesion; no mucopurulent d/c  Uterus: small, mobile  Adnexa: large mass- mobile; feels 12cm; tender to palpation  02/21/2013  EXAM:   TRANSABDOMINAL AND TRANSVAGINAL ULTRASOUND OF PELVIS  TECHNIQUE:  Both transabdominal and transvaginal ultrasound examinations of the  pelvis were performed. Transabdominal technique was performed for  global imaging of the pelvis including uterus, ovaries, adnexal  regions, and pelvic cul-de-sac. It was necessary to proceed with  endovaginal exam following the transabdominal exam to visualize the  uterus, endometrium, ovaries, and adnexa.  COMPARISON: None  FINDINGS:  Uterus  Measurements: 8.9 x 3.7 x 3.4 cm. There is a 3.1 cm exophytic lesion  at the time of the uterine fundus, most likely representing an  exophytic fibroid.  Endometrium  Thickness: Normal. 14 mm in thickness. No focal abnormality  visualized.  Right ovary  Measurements: 4.5 x 2.9 x 2.9 cm. Two simple appearing cysts on the  right ovary, each measuring 2.7 cm in size.  Left ovary  Measurements: Not visualized. There is a large cystic mass in the  left adnexa with  internal septations as well as solid components,  measuring 12.7 x 13.1 x 9.6 cm. The complex solid component measures  5.6 x 6.3 x 3.7 cm.  Other findings  Small amount of free fluid.  IMPRESSION:  Large complex mixed solid and cystic left adnexal mass measuring  13.1 cm maximum diameter. 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   11//2014 CT PELVIS WITH CONTRAST  TECHNIQUE:  Multidetector CT imaging of the pelvis was performed using the  standard protocol following the bolus administration of intravenous  contrast.  CONTRAST: OMNIPAQUE IOHEXOL 300 MG/ML SOLN  COMPARISON: Pelvic ultrasound 02/21/2013  FINDINGS:  There is a large predominantly cystic mass in the left adnexa,  producing rightward displacement of the uterus and right ovary.  Numerous cystic areas are identified within this left adnexal mass,  with suggestion of a thin rim of overlying possible ovarian tissue  for example image 15 series 2. The left ovary is not  identifiable  separate from this lesion. Together, the cystic components measure  together 15.4 x 11.6 x 8.4 cm. The dominant cyst measures 12.4 x  11.2 x 9.4 cm. No pelvic lymphadenopathy. Uterus and right ovary are  grossly unremarkable. The bladder is normal. No pelvic free fluid.  No acute osseous finding. Visualized bowel unremarkable.  For the clinical indication of possible ovarian neoplasm evaluation  and staging, the abdomen is typically included in the imaging workup  but only CT of the pelvis was ordered by the referring clinician and  therefore the abdomen was not imaged today.  IMPRESSION:  Left adnexal mass with dominant cystic component as above.  Determination of the organ of origin is best performed with pelvic  MRI in the setting of adnexal masses, however on today's exam there  is a possible thin rim of ovarian tissue at the periphery of this  mass, suggesting left ovarian origin. Primary differential  considerations include mucinous cystadenoma or cystadenocarcinoma,  endometrioma, atypical physiologic cyst, tubo-ovarian abscess if  there is a history of pelvic inflammatory disease. Surgical  consultation is recommended, as this could act as a lead point for  ovarian torsion due to its large size.  No evidence for intra pelvic metastatic disease.  03/30/2013  CA 125: 7 Assessment:   Mass on left adnexa and uterine fibroid  Plan:   Patient desires surgical management with laparotomy with left oophorectomy and myomectomy. The risks of surgery were discussed in detail with the patient including but not limited to: bleeding which may require transfusion or reoperation; infection which may require prolonged hospitalization or re-hospitalization and antibiotic therapy; injury to bowel, bladder, ureters and major vessels or other surrounding organs; need for additional procedures including laparotomy; thromboembolic phenomenon, incisional problems and other postoperative or  anesthesia complications. Patient was told that the likelihood that her condition and symptoms will be treated effectively with this surgical management was very high; the postoperative expectations were also discussed in detail. The patient also understands the alternative treatment options which were discussed in full. All questions were answered.  Pt's mother, sister and stepmother were present for the discussion prior to surgery.

## 2013-05-16 NOTE — Op Note (Signed)
05/16/2013  4:45 PM  PATIENT:  Danielle Potter  32 y.o. female  PRE-OPERATIVE DIAGNOSIS:  -Cystic left adnexal mass, asymptomatic uterine fibroids  POST-OPERATIVE DIAGNOSIS:  Left ovarian cyst; no fibroids noted  PROCEDURE:  Procedure(s): SALPINGO OOPHORECTOMY (Left) LAPAROTOMY (N/A)  SURGEON:  Surgeon(s) and Role:    * Willodean Rosenthal, MD - Primary  ASSISTANTS: Catalina Antigua, M.D.   ANESTHESIA:   local and general  EBL:  Total I/O In: 1300 [I.V.:1300] Out: 250 [Urine:100; Blood:150]  BLOOD ADMINISTERED:none  DRAINS: none   LOCAL MEDICATIONS USED:  OTHER Exparel  SPECIMEN:  Source of Specimen:  left adnexa including ovary and fallopian tube with cyst  DISPOSITION OF SPECIMEN:  PATHOLOGY  COUNTS:  YES  TOURNIQUET:  * No tourniquets in log *  DICTATION: .Note written in EPIC  PLAN OF CARE: Admit to inpatient   PATIENT DISPOSITION:  PACU - hemodynamically stable.   Delay start of Pharmacological VTE agent (>24hrs) due to surgical blood loss or risk of bleeding: yes  Findings: 13-15 cm adnexal mass on left. Dilated fallopian tube on right side with adhesion to bowel;  normal uterus- no fibroids noted   Indications: 13 cm adnexal mass with fibroid noted on sono and CT in a pt with intermittent pelvic pain and known h/o blocked tubes.    Pt here for left oophorectomy for adnexal mass and myomectomy.   The risks of surgery were discussed with the patient including but not limited to: bleeding which may require transfusion or reoperation; infection which may require antibiotics; injury to bowel, bladder, ureters or other surrounding organs; need for additional procedures; thromboembolic phenomenon, incisional problems and other postoperative/anesthesia complications. Written informed consent was obtained.    PROCEDURE IN DETAIL:  The patient received IV preoperative antibiotics approximately 30 minutes prior to procedure.  She was then taken to the operating room  and general anesthesia was administered without difficulty.  .She was placed in dorsal supine position and prepped and draped in a sterile manner.  A Foley catheter was inserted into the bladder and attached to constant drainage.  After an adequate timeout was performed, attention was then turned to the abdomen where a Pfannenstiel incision was made with a scalpel.  This incision was carried down to the fascia using electrocautery.  The fascia was incised in the midline and this incision was extended bilaterally using the Mayo scissors.  Kocher's were applied to the superior aspect of the fascial incision, and the rectus muscles were dissected off bluntly and sharply using the Mayo scissors.  A similar process was carried out at the inferior aspect of the incision.  The rectus muscles were separated in the midline bluntly and the peritoneum was identified, picked up and incised with the scalpel.  This incision was extended superiorly and inferiorly using the scalpel with good visualization of the bladder and bowel.  The adnexal mass was identified and found to be adherent to the underlying tissue.  The cyst was dissected out using sharp and blunt and sharp dissection.  Once the mass was mobilized, the infundibulopelvic ligament was clamped with kelly clamps x2 and transected and suture ligated.  The mass was removed intact.   The cornual edge of the uterus was transected and sutured ligated as well. Overall good hemostasis was noted.   The uterus was evaluated fully and found to be without visible or palpable fibroids.  The peritoneum was oversown and excellent hemostasis was noted. The laparotomy sponges were then removed from the abdomen.  The pelvic was irrigated with warmed normal saline, and surgicel was placed in the pelvis. The muscle and peritoneum were re approximated using 1 figure of eight suture of 0 vicryl.  The fascia was reapproximated with 0 Vicryl running stitch.   The skin was closed with 3-0  Vicryl subcuticular stitch using a Mellody Dance needle.  The incision was injected with Exparel 20cc mixed with 20cc of Normal saline. The patient tolerated the procedure well. There were no complications during this case.  Sponge, lap, needle and instrument counts were correct x2.  The patient was taken to the recovery room extubated and in stable condition.

## 2013-05-16 NOTE — Brief Op Note (Signed)
05/16/2013  4:45 PM  PATIENT:  Danielle Potter  32 y.o. female  PRE-OPERATIVE DIAGNOSIS:  -Cystic left adnexal mass, asymptomatic uterine fibroids  POST-OPERATIVE DIAGNOSIS:  Left ovarian cyst; no fibroids noted  PROCEDURE:  Procedure(s): SALPINGO OOPHORECTOMY (Left) LAPAROTOMY (N/A)  SURGEON:  Surgeon(s) and Role:    * Willodean Rosenthal, MD - Primary  ASSISTANTS: Catalina Antigua, M.D.   ANESTHESIA:   local and general  EBL:  Total I/O In: 1300 [I.V.:1300] Out: 250 [Urine:100; Blood:150]  BLOOD ADMINISTERED:none  DRAINS: none   LOCAL MEDICATIONS USED:  OTHER Exparel  SPECIMEN:  Source of Specimen:  left adnexa including ovary and fallopian tube with cyst  DISPOSITION OF SPECIMEN:  PATHOLOGY  COUNTS:  YES  TOURNIQUET:  * No tourniquets in log *  DICTATION: .Note written in EPIC  PLAN OF CARE: Admit to inpatient   PATIENT DISPOSITION:  PACU - hemodynamically stable.   Delay start of Pharmacological VTE agent (>24hrs) due to surgical blood loss or risk of bleeding: yes

## 2013-05-16 NOTE — Progress Notes (Signed)
When patient got up to walk, increase bleeding at incision site. Honeycomb dressing saturated and removed. New pressure dressing applied. Lisa Left-Kirb notified. New dressing clean dry and intact.

## 2013-05-16 NOTE — Transfer of Care (Signed)
Immediate Anesthesia Transfer of Care Note  Patient: Danielle Potter  Procedure(s) Performed: Procedure(s): SALPINGO OOPHORECTOMY (Left) LAPAROTOMY (N/A)  Patient Location: PACU  Anesthesia Type:General  Level of Consciousness: awake, alert  and oriented  Airway & Oxygen Therapy: Patient Spontanous Breathing and Patient connected to nasal cannula oxygen  Post-op Assessment: Report given to PACU RN and Post -op Vital signs reviewed and stable  Post vital signs: Reviewed and stable  Complications: No apparent anesthesia complications

## 2013-05-16 NOTE — Anesthesia Preprocedure Evaluation (Signed)
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   Neuro/Psych negative neurological ROS  negative psych ROS   GI/Hepatic negative GI ROS, Neg liver ROS,   Endo/Other  negative endocrine ROS  Renal/GU negative Renal ROS  Female GU complaint     Musculoskeletal   Abdominal   Peds  Hematology negative hematology ROS (+)   Anesthesia Other Findings   Reproductive/Obstetrics negative OB ROS                           Anesthesia Physical Anesthesia Plan  ASA: II  Anesthesia Plan: General ETT   Post-op Pain Management:    Induction:   Airway Management Planned:   Additional Equipment:   Intra-op Plan:   Post-operative Plan:   Informed Consent: I have reviewed the patients History and Physical, chart, labs and discussed the procedure including the risks, benefits and alternatives for the proposed anesthesia with the patient or authorized representative who has indicated his/her understanding and acceptance.   Dental Advisory Given  Plan Discussed with: CRNA and Surgeon  Anesthesia Plan Comments:         Anesthesia Quick Evaluation

## 2013-05-16 NOTE — Anesthesia Postprocedure Evaluation (Signed)
Anesthesia Post Note  Patient: Danielle Potter  Procedure(s) Performed: Procedure(s) (LRB): SALPINGO OOPHORECTOMY (Left) LAPAROTOMY (N/A)  Anesthesia type: GA  Patient location: PACU  Post pain: Pain level controlled  Post assessment: Post-op Vital signs reviewed  Last Vitals:  Filed Vitals:   05/16/13 1644  BP:   Pulse:   Temp: 36.8 C  Resp:     Post vital signs: Reviewed  Level of consciousness: sedated  Complications: No apparent anesthesia complications

## 2013-05-16 NOTE — Anesthesia Procedure Notes (Signed)
Procedure Name: Intubation Date/Time: 05/16/2013 3:34 PM Performed by: Shanon Payor Pre-anesthesia Checklist: Patient being monitored, Suction available, Emergency Drugs available, Patient identified and Timeout performed Patient Re-evaluated:Patient Re-evaluated prior to inductionOxygen Delivery Method: Circle system utilized Preoxygenation: Pre-oxygenation with 100% oxygen Intubation Type: IV induction Ventilation: Mask ventilation without difficulty Laryngoscope Size: Mac and 3 Grade View: Grade I Tube type: Oral Tube size: 7.0 mm Number of attempts: 1 Airway Equipment and Method: Stylet Placement Confirmation: ETT inserted through vocal cords under direct vision,  positive ETCO2 and breath sounds checked- equal and bilateral Secured at: 22 cm Tube secured with: Tape Dental Injury: Teeth and Oropharynx as per pre-operative assessment

## 2013-05-17 ENCOUNTER — Encounter (HOSPITAL_COMMUNITY): Payer: Self-pay | Admitting: Obstetrics & Gynecology

## 2013-05-17 LAB — CBC
HCT: 32.7 % — ABNORMAL LOW (ref 36.0–46.0)
MCH: 30.2 pg (ref 26.0–34.0)
MCHC: 34.9 g/dL (ref 30.0–36.0)
MCV: 86.7 fL (ref 78.0–100.0)
Platelets: 216 10*3/uL (ref 150–400)
RDW: 12.6 % (ref 11.5–15.5)

## 2013-05-17 MED ORDER — OXYCODONE-ACETAMINOPHEN 5-325 MG PO TABS: 1.0000 | ORAL_TABLET | Freq: Four times a day (QID) | ORAL | Status: DC | PRN

## 2013-05-17 MED ORDER — ACETAMINOPHEN 160 MG/5ML PO SOLN
325.0000 mg | ORAL | Status: DC | PRN
  Administered 2013-05-17: 650 mg via ORAL
  Filled 2013-05-17 (×2): qty 20.3

## 2013-05-17 MED ORDER — OXYCODONE HCL 5 MG/5ML PO SOLN
5.0000 mg | ORAL | Status: DC | PRN
  Administered 2013-05-17: 10 mg via ORAL
  Filled 2013-05-17: qty 10

## 2013-05-17 MED ORDER — OXYCODONE HCL 5 MG/5ML PO SOLN: 5.0000 mg | ORAL | Status: DC | PRN

## 2013-05-17 MED ORDER — IBUPROFEN 100 MG/5ML PO SUSP: 600.0000 mg | Freq: Four times a day (QID) | ORAL | Status: DC | PRN

## 2013-05-17 NOTE — Discharge Summary (Signed)
Physician Discharge Summary  Patient ID: Danielle Potter MRN: [id number] DOB/AGE: December 20, 1980 32 y.o.  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  Following surgery she has a dilaudid PCA which she said made her feel funny.  She had her foley removed post op day #1 and voided without difficulty.  She tolerated a regular diet on POD#1 with passage of flatus.  She is ambulating  Without difficulty and fell ready to go home.  Consults: None  Significant Diagnostic Studies: labs: CBC  Treatments: IV hydration, analgesia: Dilaudid and Percocet and surgery: Laparotomy with Lysis of adhesions and LSO    Discharge Exam: Blood pressure 108/65, pulse 88, temperature 98.2 F (36.8 C), temperature source Oral, resp. rate 18, height 5' (1.524 m), weight 141 lb (63.957 kg), last menstrual period 04/25/2013, SpO2 100.00%. General appearance: alert and no distress Resp: clear to auscultation bilaterally Chest wall: no tenderness Cardio: regular rate and rhythm, S1, S2 normal, no murmur, click, rub or gallop GI: soft, non-tender; bowel sounds normal; no masses,  no organomegaly Extremities: extremities normal, atraumatic, no cyanosis or edema Incision/Wound:steristrips moist. Incision clean, dry and intact  CBC    Component Value Date/Time   WBC 12.8* 05/17/2013 0533   RBC 3.77* 05/17/2013 0533   HGB 11.4* 05/17/2013 0533   HCT 32.7* 05/17/2013 0533   PLT 216 05/17/2013 0533   MCV 86.7 05/17/2013 0533   MCH 30.2 05/17/2013 0533   MCHC 34.9 05/17/2013 0533   RDW 12.6 05/17/2013 0533   LYMPHSABS 1.9 02/21/2013 1315   MONOABS 0.6 02/21/2013 1315   EOSABS  0.1 02/21/2013 1315   BASOSABS 0.0 02/21/2013 1315       Disposition: 01-Home or Self Care  Discharge Orders   Future Appointments Provider Department Dept Phone   06/01/2013 3:45 PM Willodean Rosenthal, MD [facility] [phone]   Future Orders Complete By Expires   Call MD for:  difficulty breathing, headache or visual disturbances  As directed    Call MD for:  extreme fatigue  As directed    Call MD for:  persistant dizziness or light-headedness  As directed    Call MD for:  persistant nausea and vomiting  As directed    Call MD for:  redness, tenderness, or signs of infection (pain, swelling, redness, odor or green/yellow discharge around incision site)  As directed    Call MD for:  severe uncontrolled pain  As directed    Call MD for:  temperature >100.4  As directed    Diet - low sodium heart healthy  As directed    Driving Restrictions  As directed    Comments:     No driving while on pain medication   Increase activity slowly  As directed    Leave dressing on - Keep it clean, dry, and intact until clinic visit  As directed    Lifting restrictions  As directed    Comments:     No heavy lifting for 2 weeks   Sexual Activity Restrictions  As directed    Comments:     No sexual activity for 6 weeks       Medication List  ibuprofen 100 MG/5ML suspension  Commonly known as:  ADVIL,MOTRIN  Take 30 mLs (600 mg total) by mouth every 6 (six) hours as needed.     oxyCODONE-acetaminophen 5-325 MG per tablet  Commonly known as:  PERCOCET/ROXICET  Take 1-2 tablets by mouth every 6 (six) hours as needed.     polyethylene glycol powder powder  Commonly known as:  GLYCOLAX/MIRALAX  Take 17 g by mouth daily.           Follow-up Information   Follow up with Willodean Rosenthal, MD In 2 weeks.   Specialty:  Obstetrics and Gynecology   Contact information:   [address] [place] [address] [phone]        Signed: Willodean Rosenthal 05/17/2013, 12:31 PM

## 2013-05-17 NOTE — Anesthesia Postprocedure Evaluation (Signed)
  Anesthesia Post-op Note  Patient: Danielle Potter  Procedure(s) Performed: Procedure(s): SALPINGO OOPHORECTOMY (Left) LAPAROTOMY (N/A)  Patient Location: [facility]  Anesthesia Type:General  Level of Consciousness: awake, alert  and oriented  Airway and Oxygen Therapy: Patient Spontanous Breathing and Patient connected to nasal cannula oxygen  Post-op Pain: none  Post-op Assessment: Post-op Vital signs reviewed and Patient's Cardiovascular Status Stable  Post-op Vital Signs: Reviewed and stable  Complications: No apparent anesthesia complications

## 2013-05-17 NOTE — Progress Notes (Signed)
UR completed 

## 2013-06-01 ENCOUNTER — Encounter: Payer: Self-pay | Admitting: Obstetrics & Gynecology

## 2013-06-01 ENCOUNTER — Ambulatory Visit (INDEPENDENT_AMBULATORY_CARE_PROVIDER_SITE_OTHER): Payer: Self-pay | Admitting: Obstetrics & Gynecology

## 2013-06-01 VITALS — BP 133/84 | HR 80 | Ht 60.0 in | Wt 149.7 lb

## 2013-06-01 DIAGNOSIS — Z9889 Other specified postprocedural states: Secondary | ICD-10-CM

## 2013-06-01 DIAGNOSIS — Z09 Encounter for follow-up examination after completed treatment for conditions other than malignant neoplasm: Secondary | ICD-10-CM

## 2013-06-01 NOTE — Patient Instructions (Signed)
Glad you are doing well. Your incision site looks to be healing well. Please refrain from sexual activity for the next 2 weeks. You can increase your activity level gradually at this time.

## 2013-06-01 NOTE — Progress Notes (Signed)
Patient ID: Danielle Potter, female   DOB: 09-Apr-1981, 33 y.o.   MRN: [id number] Pt presents for 2 week post op check.  She is doing well post op.  Has been off pain meds since 2 days post op.  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  Attestation of Attending Supervision of Resident: Evaluation and management procedures [facility] under my supervision.  I have seen and examined the patient, reviewed the resident's note and chart, and I agree with the management and plan.  Anibal Hendersonarolyn L Harraway-Smith, M.D. 06/01/2013 3:37 PM

## 2013-06-01 NOTE — Progress Notes (Signed)
Subjective:     Patient ID: Danielle Potter, female   DOB: 04-28-1981, 33 y.o.   MRN: [id number]  HPI Patient is a 33 yo female who presents for post-op f/u of laparotomy with left oophorectomy and myomectomy. Patient has been doing well since discharge. She is not having any pain. She is not taking any pain medications and has been off of them since 2 days after discharge. She has no current complaints.   Review of Systems see HPI     Objective:   Physical Exam  Constitutional: She appears well-developed and well-nourished. No distress.  Abdominal: Soft. She exhibits no distension and no mass. There is no tenderness.  Skin:  Suprapubic laparotomy incision well healing without erythema, drainage, or swelling  BP 133/84  Pulse 80  Ht 5' (1.524 m)  Wt 149 lb 11.2 oz (67.903 kg)  BMI 29.24 kg/m2  LMP 05/22/2013     Assessment:     S/p laparotomy with left oophorectomy and myomectomy    Plan:     Discussed gradual increase of activity. To refrain from sexual activity for the next 2 weeks.  Can return to work. Advised no lifting. Follow-up as needed.

## 2015-01-06 ENCOUNTER — Encounter (HOSPITAL_BASED_OUTPATIENT_CLINIC_OR_DEPARTMENT_OTHER): Payer: Self-pay | Admitting: *Deleted

## 2015-01-06 ENCOUNTER — Emergency Department (HOSPITAL_BASED_OUTPATIENT_CLINIC_OR_DEPARTMENT_OTHER): Payer: Self-pay

## 2015-01-06 ENCOUNTER — Emergency Department (HOSPITAL_BASED_OUTPATIENT_CLINIC_OR_DEPARTMENT_OTHER)
Admission: EM | Admit: 2015-01-06 | Discharge: 2015-01-06 | Disposition: A | Discharge status: Home / Self Care | Payer: Self-pay | Attending: Emergency Medicine | Admitting: Emergency Medicine

## 2015-01-06 DIAGNOSIS — N8320 Unspecified ovarian cysts: Secondary | ICD-10-CM | POA: Insufficient documentation

## 2015-01-06 DIAGNOSIS — R102 Pelvic and perineal pain: Secondary | ICD-10-CM

## 2015-01-06 DIAGNOSIS — Z8774 Personal history of (corrected) congenital malformations of heart and circulatory system: Secondary | ICD-10-CM | POA: Insufficient documentation

## 2015-01-06 DIAGNOSIS — N83201 Unspecified ovarian cyst, right side: Secondary | ICD-10-CM

## 2015-01-06 DIAGNOSIS — Z79899 Other long term (current) drug therapy: Secondary | ICD-10-CM | POA: Insufficient documentation

## 2015-01-06 DIAGNOSIS — Z720 Tobacco use: Secondary | ICD-10-CM | POA: Insufficient documentation

## 2015-01-06 DIAGNOSIS — R1033 Periumbilical pain: Secondary | ICD-10-CM | POA: Insufficient documentation

## 2015-01-06 DIAGNOSIS — K5900 Constipation, unspecified: Secondary | ICD-10-CM | POA: Insufficient documentation

## 2015-01-06 DIAGNOSIS — Z3202 Encounter for pregnancy test, result negative: Secondary | ICD-10-CM | POA: Insufficient documentation

## 2015-01-06 LAB — CBC
HCT: 39.9 % (ref 36.0–46.0)
Hemoglobin: 14.1 g/dL (ref 12.0–15.0)
MCH: 30.7 pg (ref 26.0–34.0)
MCHC: 35.3 g/dL (ref 30.0–36.0)
MCV: 86.9 fL (ref 78.0–100.0)
PLATELETS: 257 10*3/uL (ref 150–400)
RBC: 4.59 MIL/uL (ref 3.87–5.11)
RDW: 12.3 % (ref 11.5–15.5)
WBC: 6.6 10*3/uL (ref 4.0–10.5)

## 2015-01-06 LAB — COMPREHENSIVE METABOLIC PANEL
ALT: 8 U/L — AB (ref 14–54)
AST: 13 U/L — ABNORMAL LOW (ref 15–41)
Albumin: 4 g/dL (ref 3.5–5.0)
Alkaline Phosphatase: 39 U/L (ref 38–126)
Anion gap: 6 (ref 5–15)
BUN: 11 mg/dL (ref 6–20)
CHLORIDE: 108 mmol/L (ref 101–111)
CO2: 23 mmol/L (ref 22–32)
Calcium: 9 mg/dL (ref 8.9–10.3)
Creatinine, Ser: 0.69 mg/dL (ref 0.44–1.00)
GFR calc non Af Amer: >60 mL/min (ref >60)
Glucose, Bld: 90 mg/dL (ref 65–99)
POTASSIUM: 3.9 mmol/L (ref 3.5–5.1)
SODIUM: 137 mmol/L (ref 135–145)
Total Bilirubin: 0.5 mg/dL (ref 0.3–1.2)
Total Protein: 6.8 g/dL (ref 6.5–8.1)

## 2015-01-06 LAB — URINALYSIS, ROUTINE W REFLEX MICROSCOPIC
Bilirubin Urine: NEGATIVE
Glucose, UA: NEGATIVE mg/dL
Hgb urine dipstick: NEGATIVE
KETONES UR: NEGATIVE mg/dL
LEUKOCYTES UA: NEGATIVE
NITRITE: NEGATIVE
Protein, ur: NEGATIVE mg/dL
Specific Gravity, Urine: 1.012 (ref 1.005–1.030)
Urobilinogen, UA: 0.2 mg/dL (ref 0.0–1.0)
pH: 7.5 (ref 5.0–8.0)

## 2015-01-06 LAB — PREGNANCY, URINE: Preg Test, Ur: NEGATIVE

## 2015-01-06 LAB — LIPASE, BLOOD: Lipase: 31 U/L (ref 22–51)

## 2015-01-06 MED ORDER — IOHEXOL 300 MG/ML  SOLN
25.0000 mL | Freq: Once | INTRAMUSCULAR | Status: AC | PRN
Start: 2015-01-06 — End: 2015-01-06
  Administered 2015-01-06: 25 mL via ORAL

## 2015-01-06 MED ORDER — IOHEXOL 300 MG/ML  SOLN
100.0000 mL | Freq: Once | INTRAMUSCULAR | Status: AC | PRN
Start: 2015-01-06 — End: 2015-01-06
  Administered 2015-01-06: 100 mL via INTRAVENOUS

## 2015-01-06 NOTE — ED Notes (Addendum)
Patient c/o L side abd pain, hurts more when she coughs. She has had a cold for the past two weeks and taking mucinex and cough syrup. no difficulty urinating. Painful, burning sensation when she moves

## 2015-01-06 NOTE — ED Provider Notes (Signed)
CSN: [id number]     Arrival date & time 01/06/15  1007 History   First MD Initiated Contact with Patient 01/06/15 1011     Chief Complaint  Patient presents with  . Abdominal Pain     (Consider location/radiation/quality/duration/timing/severity/associated sxs/prior Treatment) HPI  Pt presenting with c/o sharp and burning pain in abdomen with coughing and with movement.  Pain is located to the left of umbilicus.  Pt states the pain came on acutely with coughing and has been present since that time and worsening.  Started 4 days ago.  Pain is intermittent.  No vomiting.  No change in stools.  Pt is concerned there may be a hernia.  She has not taken anything for her symptoms.  There are no other associated systemic symptoms, there are no other alleviating or modifying factors.   Past Medical History  Diagnosis Date  . Constipation   . 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 Appendectomy    . Salpingoophorectomy Left 05/16/2013    Procedure: SALPINGO OOPHORECTOMY;  Surgeon: Willodean Rosenthal, MD;  Location: [facility];  Service: Gynecology;  Laterality: Left;  . Laparotomy N/A 05/16/2013    Procedure: LAPAROTOMY;  Surgeon: Willodean Rosenthal, MD;  Location: [facility];  Service: Gynecology;  Laterality: N/A;   No family history on file. Social History  Substance Use Topics  . Smoking status: Current Every Day Smoker -- 0.50 packs/day for 10 years    Types: Cigarettes  . Smokeless tobacco: None  . Alcohol Use: Yes     Comment: occasional   OB History    No data available     Review of Systems  ROS reviewed and all otherwise negative except for mentioned in HPI    Allergies  Review of patient's allergies indicates no known allergies.  Home Medications   Prior to Admission medications   Medication Sig Start Date End Date Taking? Authorizing Provider  ibuprofen (ADVIL,MOTRIN)  100 MG/5ML suspension Take 30 mLs (600 mg total) by mouth every 6 (six) hours as needed. 05/17/13   Willodean Rosenthal, MD  oxyCODONE-acetaminophen (PERCOCET/ROXICET) 5-325 MG per tablet Take 1-2 tablets by mouth every 6 (six) hours as needed. 05/17/13   Willodean Rosenthal, MD  polyethylene glycol powder (GLYCOLAX/MIRALAX) powder Take 17 g by mouth daily. 02/21/13   Abigail Harris, PA-C   BP 121/70 mmHg  Pulse 64  Temp(Src) 98.2 F (36.8 C) (Oral)  Resp 16  Ht 5' (1.524 m)  Wt 130 lb (58.968 kg)  BMI 25.39 kg/m2  SpO2 100%  LMP 12/24/2014 (Approximate)  Vitals reviewed Physical Exam  Physical Examination: General appearance - alert, well appearing, and in no distress Mental status - alert, oriented to person, place, and time Eyes - no conjunctival injection, no scleral icterus Mouth - mucous membranes moist, pharynx normal without lesions Chest - clear to auscultation, no wheezes, rales or rhonchi, symmetric air entry Heart - normal rate, regular rhythm, normal S1, S2, no murmurs, rubs, clicks or gallops Abdomen - soft, ttp in discrete spot of left mid abdomen to the left of umbilicus, no hernia palpated, nondistended, no masses or organomegaly, nabs Neurological - alert, oriented, normal speech, no focal findings or movement disorder noted Extremities - peripheral pulses normal, no pedal edema, no clubbing or cyanosis Skin - normal coloration and turgor, no rashes  ED Course  Procedures (including critical care time) Labs Review Labs Reviewed  COMPREHENSIVE METABOLIC PANEL - Abnormal; Notable for the following:    AST 13 (*)    ALT 8 (*)    All other components within normal limits  URINALYSIS, ROUTINE W REFLEX MICROSCOPIC (NOT AT [facility])  PREGNANCY, URINE  CBC  LIPASE, BLOOD    Imaging Review US Transvaginal Non-ob  01/06/2015   CLINICAL DATA:  Left lower quadrant pain for 4 days. Cystic right adnexal mass on recent CT and right adnexal tenderness on exam. Past  surgical history of left salpingo-oophorectomy. LMP 12/24/2014.  EXAM: TRANSABDOMINAL AND TRANSVAGINAL ULTRASOUND OF PELVIS  TECHNIQUE: Both transabdominal and transvaginal ultrasound examinations of the pelvis were performed. Transabdominal technique was performed for global imaging of the pelvis including uterus, ovaries, adnexal regions, and pelvic cul-de-sac. It was necessary to proceed with endovaginal exam following the transabdominal exam to visualize the endometrium, ovaries, and right adnexal mass.  COMPARISON:  CT on 01/06/2015 and 03/31/2013  FINDINGS: Uterus  Measurements: 9.3 x 3.3 x 4.2 cm. A tiny less than 1 cm intramural fibroid is seen in the posterior corpus.  Endometrium  Thickness: 9 mm.  No focal abnormality visualized.  Right ovary  Measurements: No normal ovary visualized. A complex cystic lesion with uniform low level internal echoes is seen in the right adnexa measuring 3.6 cm. This is suspicious for a hemorrhagic cyst or endometrioma. There is also a thick-walled complex cystic lesion in the right adnexa which has a somewhat tubular appearance. This is suspicious for a hydrosalpinx.  Left ovary  Measurements: Surgically absent. A simple appearing cyst is seen in the left adnexa which measures 2.3 cm and has benign characteristics.  Other findings  No free fluid.  IMPRESSION: Tiny less than 1 cm posterior uterine fibroid.  3.6 cm complex right adnexal cyst, likely representing a hemorrhagic ovarian cyst or endometrioma. Additional complex cystic lesion in right adnexa likely represents a hydrosalpinx. Followup by pelvic ultrasound is recommended in 6-12 weeks. This recommendation follows the consensus statement: Management of Asymptomatic Ovarian and Other Adnexal Cysts Imaged at [place]: Society of Radiologists in Ultrasound Consensus Conference Statement. Radiology 2010; [phone].  Prior left oophorectomy. 2.3 cm benign-appearing left adnexal cyst. Attention also recommended at time of  follow-up ultrasound.   Electronically Signed   By: Myles Rosenthal M.D.   On: 01/06/2015 13:58   US Pelvis Complete  01/06/2015   CLINICAL DATA:  Left lower quadrant pain for 4 days. Cystic right adnexal mass on recent CT and right adnexal tenderness on exam. Past surgical history of left salpingo-oophorectomy. LMP 12/24/2014.  EXAM: TRANSABDOMINAL AND TRANSVAGINAL ULTRASOUND OF PELVIS  TECHNIQUE: Both transabdominal and transvaginal ultrasound examinations of the pelvis were performed. Transabdominal technique was performed for global imaging of the pelvis including uterus, ovaries, adnexal regions, and pelvic cul-de-sac. It was necessary to proceed with endovaginal exam following the transabdominal exam to visualize the endometrium, ovaries, and right adnexal mass.  COMPARISON:  CT on 01/06/2015 and 03/31/2013  FINDINGS: Uterus  Measurements: 9.3 x 3.3 x 4.2 cm. A tiny less than 1 cm intramural fibroid is seen in the posterior corpus.  Endometrium  Thickness: 9 mm.  No focal abnormality visualized.  Right ovary  Measurements: No normal ovary visualized. A complex cystic lesion with uniform low level internal echoes is seen in the right adnexa measuring 3.6 cm. This is suspicious for a hemorrhagic cyst or endometrioma. There is also a thick-walled complex cystic lesion in the right adnexa which has a somewhat tubular appearance. This is suspicious for a  hydrosalpinx.  Left ovary  Measurements: Surgically absent. A simple appearing cyst is seen in the left adnexa which measures 2.3 cm and has benign characteristics.  Other findings  No free fluid.  IMPRESSION: Tiny less than 1 cm posterior uterine fibroid.  3.6 cm complex right adnexal cyst, likely representing a hemorrhagic ovarian cyst or endometrioma. Additional complex cystic lesion in right adnexa likely represents a hydrosalpinx. Followup by pelvic ultrasound is recommended in 6-12 weeks. This recommendation follows the consensus statement: Management of  Asymptomatic Ovarian and Other Adnexal Cysts Imaged at [place]: Society of Radiologists in Ultrasound Consensus Conference Statement. Radiology 2010; [phone].  Prior left oophorectomy. 2.3 cm benign-appearing left adnexal cyst. Attention also recommended at time of follow-up ultrasound.   Electronically Signed   By: Myles Rosenthal M.D.   On: 01/06/2015 13:58   Ct Abdomen Pelvis W Contrast  01/06/2015   CLINICAL DATA:  Patient with left lower abdominal pain for 4 days.  EXAM: CT ABDOMEN AND PELVIS WITH CONTRAST  TECHNIQUE: Multidetector CT imaging of the abdomen and pelvis was performed using the standard protocol following bolus administration of intravenous contrast.  CONTRAST:  OMNIPAQUE IOHEXOL 300 MG/ML SOLN, 25mL OMNIPAQUE IOHEXOL 300 MG/ML SOLN  COMPARISON:  CT abdomen 03/31/2013  FINDINGS: Lower chest: Normal heart size. No consolidative or nodular pulmonary opacities.  Hepatobiliary: Liver is normal in size and contour. No focal hepatic lesion identified. Gallbladder is unremarkable. No intrahepatic or extrahepatic biliary ductal dilatation.  Pancreas: Unremarkable  Spleen: Unremarkable  Adrenals/Urinary Tract: The adrenal glands are normal. The kidneys enhance symmetrically with contrast. No hydronephrosis. Urinary bladder is unremarkable.  Stomach/Bowel: No evidence for bowel obstruction. No free fluid or free intraperitoneal air.  Vascular/Lymphatic: Normal caliber abdominal aorta. No retroperitoneal lymphadenopathy.  Other: Uterus is unremarkable. There is a 6.4 cm multi locular cystic mass in the right adnexa. A portion of this mass may be secondary to adjacent fluid-filled small bowel.  Musculoskeletal: No aggressive or acute appearing osseous lesions.  IMPRESSION: There is a 6 cm multi locular cystic mass within the right adnexa which may represent a cystic ovarian mass or potentially hydrosalpinx. Recommend correlation with dedicated pelvic ultrasound for definitive characterization as a portion  of this apparent mass may also be secondary to adjacent fluid-filled small bowel.  Otherwise no acute process within the abdomen or pelvis.   Electronically Signed   By: Annia Belt M.D.   On: 01/06/2015 12:11   I have personally reviewed and evaluated these images and lab results as part of my medical decision-making.   EKG Interpretation None      MDM   Final diagnoses:  Periumbilical abdominal pain  Right ovarian cyst    Pt presenting with abdominal pain that feels burning and sharp- sounds most c/w pulled muscle, no hernia or other acute abnormalities on CT scan, incidental finding of adnexal cyst on right side- ultrasound obtained and recommending a repeat ultrasound in 6-12 weeks- pt will nee to f/u with GYN.  Discharged with strict return precautions.  Pt agreeable with plan.    Jerelyn Scott, MD 01/06/15 [phone]

## 2015-01-06 NOTE — Discharge Instructions (Signed)
Return to the ED with any concerns including vomiting, worsening pain, fever/chills, decreased level of alertness/lethargy, or any other alarming symptoms  The ultrasound of your pelvis showed the following:  You should arrange to have a repeat ultrasound in 6-12 weeks  3.6 cm complex right adnexal cyst, likely representing a hemorrhagic ovarian cyst or endometrioma. Additional complex cystic lesion in right adnexa likely represents a hydrosalpinx. Followup by pelvic ultrasound is recommended in 6-12 weeks. This recommendation follows the consensus statement: Management of Asymptomatic Ovarian and Other Adnexal Cysts Imaged at [place]: Society of Radiologists in Ultrasound Consensus Conference Statement. Radiology 2010; [phone].

## 2015-01-28 ENCOUNTER — Encounter: Payer: Self-pay | Admitting: Obstetrics & Gynecology

## 2015-01-28 ENCOUNTER — Ambulatory Visit (INDEPENDENT_AMBULATORY_CARE_PROVIDER_SITE_OTHER): Payer: Self-pay | Admitting: Obstetrics & Gynecology

## 2015-01-28 VITALS — BP 117/72 | HR 78 | Temp 98.4°F | Ht 62.0 in | Wt 133.1 lb

## 2015-01-28 DIAGNOSIS — N8320 Unspecified ovarian cysts: Secondary | ICD-10-CM

## 2015-01-28 DIAGNOSIS — N83201 Unspecified ovarian cyst, right side: Secondary | ICD-10-CM

## 2015-01-28 NOTE — Progress Notes (Signed)
Patient ID: Danielle Potter, female   DOB: 01-10-1981, 34 y.o.   MRN: [id number] History:  34 y.o. No obstetric history on file. here today for eval of an ovarian cyst that was noted incidentally during an ER visit. Pt reports that she was being seen for left side pain that she developed after coughing.  She reports that her pain has resolved currently but, she was told to f/u with GYN.  She reports no abnormal bleeding or pain at present.    The following portions of the patient's history were reviewed and updated as appropriate: allergies, current medications, past family history, past medical history, past social history, past surgical history and problem list.  Review of Systems:  A comprehensive review of systems was negative.  Objective:  Physical Exam Blood pressure 117/72, pulse 78, temperature 98.4 F (36.9 C), temperature source Oral, height 5\' 2"  (1.575 m), weight 133 lb 1.6 oz (60.374 kg), last menstrual period 01/13/2015. Gen: NAD Lungs: CTA CV: RRR Abd: Soft, nontender and non distended. Well healed transverse and  Pelvic: Normal appearing external genitalia; normal appearing vaginal mucosa and cervix.  Normal discharge.  Small uterus, no other palpable masses, no uterine or adnexal tenderness  Labs and Imaging US Transvaginal Non-ob  01/06/2015   CLINICAL DATA:  Left lower quadrant pain for 4 days. Cystic right adnexal mass on recent CT and right adnexal tenderness on exam. Past surgical history of left salpingo-oophorectomy. LMP 12/24/2014.  EXAM: TRANSABDOMINAL AND TRANSVAGINAL ULTRASOUND OF PELVIS  TECHNIQUE: Both transabdominal and transvaginal ultrasound examinations of the pelvis were performed. Transabdominal technique was performed for global imaging of the pelvis including uterus, ovaries, adnexal regions, and pelvic cul-de-sac. It was necessary to proceed with endovaginal exam following the transabdominal exam to visualize the endometrium, ovaries, and right adnexal mass.   COMPARISON:  CT on 01/06/2015 and 03/31/2013  FINDINGS: Uterus  Measurements: 9.3 x 3.3 x 4.2 cm. A tiny less than 1 cm intramural fibroid is seen in the posterior corpus.  Endometrium  Thickness: 9 mm.  No focal abnormality visualized.  Right ovary  Measurements: No normal ovary visualized. A complex cystic lesion with uniform low level internal echoes is seen in the right adnexa measuring 3.6 cm. This is suspicious for a hemorrhagic cyst or endometrioma. There is also a thick-walled complex cystic lesion in the right adnexa which has a somewhat tubular appearance. This is suspicious for a hydrosalpinx.  Left ovary  Measurements: Surgically absent. A simple appearing cyst is seen in the left adnexa which measures 2.3 cm and has benign characteristics.  Other findings  No free fluid.  IMPRESSION: Tiny less than 1 cm posterior uterine fibroid.  3.6 cm complex right adnexal cyst, likely representing a hemorrhagic ovarian cyst or endometrioma. Additional complex cystic lesion in right adnexa likely represents a hydrosalpinx. Followup by pelvic ultrasound is recommended in 6-12 weeks. This recommendation follows the consensus statement: Management of Asymptomatic Ovarian and Other Adnexal Cysts Imaged at [place]: Society of Radiologists in Ultrasound Consensus Conference Statement. Radiology 2010; [phone].  Prior left oophorectomy. 2.3 cm benign-appearing left adnexal cyst. Attention also recommended at time of follow-up ultrasound.   Electronically Signed   By: Myles Rosenthal M.D.   On: 01/06/2015 13:58   US Pelvis Complete  01/06/2015   CLINICAL DATA:  Left lower quadrant pain for 4 days. Cystic right adnexal mass on recent CT and right adnexal tenderness on exam. Past surgical history of left salpingo-oophorectomy. LMP 12/24/2014.  EXAM: TRANSABDOMINAL AND TRANSVAGINAL ULTRASOUND  OF PELVIS  TECHNIQUE: Both transabdominal and transvaginal ultrasound examinations of the pelvis were performed. Transabdominal technique  was performed for global imaging of the pelvis including uterus, ovaries, adnexal regions, and pelvic cul-de-sac. It was necessary to proceed with endovaginal exam following the transabdominal exam to visualize the endometrium, ovaries, and right adnexal mass.  COMPARISON:  CT on 01/06/2015 and 03/31/2013  FINDINGS: Uterus  Measurements: 9.3 x 3.3 x 4.2 cm. A tiny less than 1 cm intramural fibroid is seen in the posterior corpus.  Endometrium  Thickness: 9 mm.  No focal abnormality visualized.  Right ovary  Measurements: No normal ovary visualized. A complex cystic lesion with uniform low level internal echoes is seen in the right adnexa measuring 3.6 cm. This is suspicious for a hemorrhagic cyst or endometrioma. There is also a thick-walled complex cystic lesion in the right adnexa which has a somewhat tubular appearance. This is suspicious for a hydrosalpinx.  Left ovary  Measurements: Surgically absent. A simple appearing cyst is seen in the left adnexa which measures 2.3 cm and has benign characteristics.  Other findings  No free fluid.  IMPRESSION: Tiny less than 1 cm posterior uterine fibroid.  3.6 cm complex right adnexal cyst, likely representing a hemorrhagic ovarian cyst or endometrioma. Additional complex cystic lesion in right adnexa likely represents a hydrosalpinx. Followup by pelvic ultrasound is recommended in 6-12 weeks. This recommendation follows the consensus statement: Management of Asymptomatic Ovarian and Other Adnexal Cysts Imaged at [place]: Society of Radiologists in Ultrasound Consensus Conference Statement. Radiology 2010; [phone].  Prior left oophorectomy. 2.3 cm benign-appearing left adnexal cyst. Attention also recommended at time of follow-up ultrasound.   Electronically Signed   By: Myles Rosenthal M.D.   On: 01/06/2015 13:58   Ct Abdomen Pelvis W Contrast  01/06/2015   CLINICAL DATA:  Patient with left lower abdominal pain for 4 days.  EXAM: CT ABDOMEN AND PELVIS WITH CONTRAST   TECHNIQUE: Multidetector CT imaging of the abdomen and pelvis was performed using the standard protocol following bolus administration of intravenous contrast.  CONTRAST:  OMNIPAQUE IOHEXOL 300 MG/ML SOLN, 25mL OMNIPAQUE IOHEXOL 300 MG/ML SOLN  COMPARISON:  CT abdomen 03/31/2013  FINDINGS: Lower chest: Normal heart size. No consolidative or nodular pulmonary opacities.  Hepatobiliary: Liver is normal in size and contour. No focal hepatic lesion identified. Gallbladder is unremarkable. No intrahepatic or extrahepatic biliary ductal dilatation.  Pancreas: Unremarkable  Spleen: Unremarkable  Adrenals/Urinary Tract: The adrenal glands are normal. The kidneys enhance symmetrically with contrast. No hydronephrosis. Urinary bladder is unremarkable.  Stomach/Bowel: No evidence for bowel obstruction. No free fluid or free intraperitoneal air.  Vascular/Lymphatic: Normal caliber abdominal aorta. No retroperitoneal lymphadenopathy.  Other: Uterus is unremarkable. There is a 6.4 cm multi locular cystic mass in the right adnexa. A portion of this mass may be secondary to adjacent fluid-filled small bowel.  Musculoskeletal: No aggressive or acute appearing osseous lesions.  IMPRESSION: There is a 6 cm multi locular cystic mass within the right adnexa which may represent a cystic ovarian mass or potentially hydrosalpinx. Recommend correlation with dedicated pelvic ultrasound for definitive characterization as a portion of this apparent mass may also be secondary to adjacent fluid-filled small bowel.  Otherwise no acute process within the abdomen or pelvis.   Electronically Signed   By: Annia Belt M.D.   On: 01/06/2015 12:11    Assessment & Plan:  Right adnexal mass- suspect benign cyst and possible hydrosalpinx.  Pt is asymptomatic on that side Rec  repeat sono in 3 months  Pt to f/u sooner prn  Veera Stapleton L. Harraway-Smith, M.D., [place]

## 2015-01-28 NOTE — Patient Instructions (Signed)
Ovarian Cyst An ovarian cyst is a fluid-filled sac that forms on an ovary. The ovaries are small organs that produce eggs in women. Various types of cysts can form on the ovaries. Most are not cancerous. Many do not cause problems, and they often go away on their own. Some may cause symptoms and require treatment. Common types of ovarian cysts include:  Functional cysts--These cysts may occur every month during the menstrual cycle. This is normal. The cysts usually go away with the next menstrual cycle if the woman does not get pregnant. Usually, there are no symptoms with a functional cyst.  Endometrioma cysts--These cysts form from the tissue that lines the uterus. They are also called "chocolate cysts" because they become filled with blood that turns brown. This type of cyst can cause pain in the lower abdomen during intercourse and with your menstrual period.  Cystadenoma cysts--This type develops from the cells on the outside of the ovary. These cysts can get very big and cause lower abdomen pain and pain with intercourse. This type of cyst can twist on itself, cut off its blood supply, and cause severe pain. It can also easily rupture and cause a lot of pain.  Dermoid cysts--This type of cyst is sometimes found in both ovaries. These cysts may contain different kinds of body tissue, such as skin, teeth, hair, or cartilage. They usually do not cause symptoms unless they get very big.  Theca lutein cysts--These cysts occur when too much of a certain hormone (human chorionic gonadotropin) is produced and overstimulates the ovaries to produce an egg. This is most common after procedures used to assist with the conception of a baby (in vitro fertilization). CAUSES   Fertility drugs can cause a condition in which multiple large cysts are formed on the ovaries. This is called ovarian hyperstimulation syndrome.  A condition called polycystic ovary syndrome can cause hormonal imbalances that can lead to  nonfunctional ovarian cysts. SIGNS AND SYMPTOMS  Many ovarian cysts do not cause symptoms. If symptoms are present, they may include:  Pelvic pain or pressure.  Pain in the lower abdomen.  Pain during sexual intercourse.  Increasing girth (swelling) of the abdomen.  Abnormal menstrual periods.  Increasing pain with menstrual periods.  Stopping having menstrual periods without being pregnant. DIAGNOSIS  These cysts are commonly found during a routine or annual pelvic exam. Tests may be ordered to find out more about the cyst. These tests may include:  Ultrasound.  X-ray of the pelvis.  CT scan.  MRI.  Blood tests. TREATMENT  Many ovarian cysts go away on their own without treatment. Your health care provider may want to check your cyst regularly for 2-3 months to see if it changes. For women in menopause, it is particularly important to monitor a cyst closely because of the higher rate of ovarian cancer in menopausal women. When treatment is needed, it may include any of the following:  A procedure to drain the cyst (aspiration). This may be done using a long needle and ultrasound. It can also be done through a laparoscopic procedure. This involves using a thin, lighted tube with a tiny camera on the end (laparoscope) inserted through a small incision.  Surgery to remove the whole cyst. This may be done using laparoscopic surgery or an open surgery involving a larger incision in the lower abdomen.  Hormone treatment or birth control pills. These methods are sometimes used to help dissolve a cyst. HOME CARE INSTRUCTIONS   Only take over-the-counter   or prescription medicines as directed by your health care provider.  Follow up with your health care provider as directed.  Get regular pelvic exams and Pap tests. SEEK MEDICAL CARE IF:   Your periods are late, irregular, or painful, or they stop.  Your pelvic pain or abdominal pain does not go away.  Your abdomen becomes  larger or swollen.  You have pressure on your bladder or trouble emptying your bladder completely.  You have pain during sexual intercourse.  You have feelings of fullness, pressure, or discomfort in your stomach.  You lose weight for no apparent reason.  You feel generally ill.  You become constipated.  You lose your appetite.  You develop acne.  You have an increase in body and facial hair.  You are gaining weight, without changing your exercise and eating habits.  You think you are pregnant. SEEK IMMEDIATE MEDICAL CARE IF:   You have increasing abdominal pain.  You feel sick to your stomach (nauseous), and you throw up (vomit).  You develop a fever that comes on suddenly.  You have abdominal pain during a bowel movement.  Your menstrual periods become heavier than usual. MAKE SURE YOU:  Understand these instructions.  Will watch your condition.  Will get help right away if you are not doing well or get worse. Document Released: 05/04/2005 Document Revised: 05/09/2013 Document Reviewed: 01/09/2013 Exit[employer] Patient Information 2015 [employer]. This information is not intended to replace advice given to you by your health care provider. Make sure you discuss any questions you have with your health care provider.  

## 2015-01-28 NOTE — Progress Notes (Signed)
[place] scheduled for November 22nd @ 0800

## 2015-04-09 ENCOUNTER — Ambulatory Visit (HOSPITAL_COMMUNITY): Admission: RE | Admit: 2015-04-09 | Payer: Self-pay | Source: Ambulatory Visit

## 2016-05-21 ENCOUNTER — Emergency Department (HOSPITAL_BASED_OUTPATIENT_CLINIC_OR_DEPARTMENT_OTHER)
Admission: EM | Admit: 2016-05-21 | Discharge: 2016-05-21 | Disposition: A | Discharge status: Home / Self Care | Payer: Self-pay | Attending: Emergency Medicine | Admitting: Emergency Medicine

## 2016-05-21 ENCOUNTER — Encounter (HOSPITAL_BASED_OUTPATIENT_CLINIC_OR_DEPARTMENT_OTHER): Payer: Self-pay | Admitting: *Deleted

## 2016-05-21 DIAGNOSIS — F17210 Nicotine dependence, cigarettes, uncomplicated: Secondary | ICD-10-CM | POA: Insufficient documentation

## 2016-05-21 DIAGNOSIS — B9789 Other viral agents as the cause of diseases classified elsewhere: Secondary | ICD-10-CM

## 2016-05-21 DIAGNOSIS — J069 Acute upper respiratory infection, unspecified: Secondary | ICD-10-CM | POA: Insufficient documentation

## 2016-05-21 MED ORDER — BENZONATATE 100 MG PO CAPS: 100.0000 mg | ORAL_CAPSULE | Freq: Three times a day (TID) | ORAL | 0 refills | Status: DC | PRN

## 2016-05-21 MED ORDER — FLUTICASONE PROPIONATE 50 MCG/ACT NA SUSP: 2.0000 | Freq: Every day | NASAL | 1 refills | Status: AC

## 2016-05-21 MED ORDER — CETIRIZINE HCL 1 MG/ML PO SYRP: 10.0000 mg | ORAL_SOLUTION | Freq: Every day | ORAL | 1 refills | Status: AC

## 2016-05-21 MED FILL — CHILD CETIRIZINE HCL 1 MG/M: 5 | 12 days supply | Qty: 120 | Fill #0

## 2016-05-21 MED FILL — FLUTICASONE PROP 50 MCG SPR: 50 | 30 days supply | Qty: 16 | Fill #0

## 2016-05-21 MED FILL — BENZONATATE 100 MG CAPSULE: 100 | 7 days supply | Qty: 21 | Fill #0

## 2016-05-21 NOTE — ED Provider Notes (Signed)
[facility]   CSN: [id number] Arrival date & time: 05/21/16  0912     History   Chief Complaint Chief Complaint  Patient presents with  . Cough    HPI CORLIS ANGELICA is a 36 y.o. female.  ANNELLA PROWELL is a 36 y.o. Female who presents to the ED complaining of URI symptoms for about 9 days. Patient reports symptoms of sneezing, nasal congestion, postnasal drip, cough, and ear pressure for about the past 9 days. She denies any fevers. No sore throat. No shortness of breath. She reports some improvement with OTC cold and flu medication. No treatments prior to arrival today. She denies fevers, wheezing, shortness of breath, abdominal pain, nausea, vomiting, diarrhea, rashes or sore throat.   The history is provided by the patient. No language interpreter was used.  Cough  Associated symptoms include ear pain and rhinorrhea. Pertinent negatives include no chest pain, no chills, no headaches, no sore throat, no myalgias, no shortness of breath and no wheezing.    Past Medical History:  Diagnosis Date  . Constipation   . 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 Other and unspecified ovarian cyst 05/16/2013  . Pelvic pain in female 05/16/2013  . 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 Postoperative state 05/16/2013    Past Surgical History:  Procedure Laterality Date  . APPENDECTOMY    . LAPAROTOMY N/A 05/16/2013   Procedure: LAPAROTOMY;  Surgeon: Willodean Rosenthal, MD;  Location: [facility];  Service: Gynecology;  Laterality: N/A;  . SALPINGOOPHORECTOMY Left 05/16/2013   Procedure: SALPINGO OOPHORECTOMY;  Surgeon: Willodean Rosenthal, MD;  Location: [facility];  Service: Gynecology;  Laterality: Left;    OB History    No data available       Home Medications    Prior to Admission medications     Medication Sig Start Date End Date Taking? Authorizing Provider  benzonatate (TESSALON) 100 MG capsule Take 1 capsule (100 mg total) by mouth 3 (three) times daily as needed for cough. 05/21/16   Everlene Farrier, PA-C  cetirizine (ZYRTEC) 1 MG/ML syrup Take 10 mLs (10 mg total) by mouth daily. 05/21/16   Everlene Farrier, PA-C  fluticasone (FLONASE) 50 MCG/ACT nasal spray Place 2 sprays into both nostrils daily. 05/21/16   Everlene Farrier, PA-C  ibuprofen (ADVIL,MOTRIN) 100 MG/5ML suspension Take 30 mLs (600 mg total) by mouth every 6 (six) hours as needed. 05/17/13   Willodean Rosenthal, MD  oxyCODONE-acetaminophen (PERCOCET/ROXICET) 5-325 MG per tablet Take 1-2 tablets by mouth every 6 (six) hours as needed. 05/17/13   Willodean Rosenthal, MD  polyethylene glycol powder (GLYCOLAX/MIRALAX) powder Take 17 g by mouth daily. 02/21/13   Arthor Captain, PA-C    Family History No family history on file.  Social History Social History  Substance Use Topics  . Smoking status: Current Every Day Smoker    Packs/day: 0.50    Years: 10.00    Types: Cigarettes  . Smokeless tobacco: Never Used  . Alcohol use Yes     Comment: occasional     Allergies   Patient has no known allergies.   Review of Systems Review of Systems  Constitutional: Negative for chills and fever.  HENT: Positive for congestion, ear pain, postnasal drip, rhinorrhea, sinus pressure and sneezing. Negative for ear discharge, sore throat and trouble swallowing.   Eyes: Negative  for visual disturbance.  Respiratory: Positive for cough. Negative for shortness of breath and wheezing.   Cardiovascular: Negative for chest pain.  Gastrointestinal: Negative for abdominal pain, diarrhea, nausea and vomiting.  Musculoskeletal: Negative for myalgias.  Skin: Negative for rash.  Neurological: Negative for headaches.     Physical Exam Updated Vital Signs BP 133/85 (BP Location: Left Arm)   Pulse 76   Temp 98.3 F (36.8 C) (Oral)    Resp 20   LMP 05/10/2016   SpO2 99%   Physical Exam  Constitutional: She appears well-developed and well-nourished. No distress.  Nontoxic appearing.  HENT:  Head: Normocephalic and atraumatic.  Right Ear: External ear normal.  Left Ear: External ear normal.  Mouth/Throat: Oropharynx is clear and moist. No oropharyngeal exudate.  Mild middle ear effusion bilaterally. No TM erythema or loss of landmarks. Boggy nasal turbinates bilaterally. Rhinorrhea present. Evidence of postnasal drip. No tonsillar hypertrophy or exudates.  Eyes: Conjunctivae are normal. Pupils are equal, round, and reactive to light. Right eye exhibits no discharge. Left eye exhibits no discharge.  Neck: Normal range of motion. Neck supple.  Cardiovascular: Normal rate, regular rhythm, normal heart sounds and intact distal pulses.   Pulmonary/Chest: Effort normal and breath sounds normal. No stridor. No respiratory distress. She has no wheezes. She has no rales.  Lungs are clear to auscultation bilaterally. No wheezes. No rales or rhonchi.  Abdominal: Soft. There is no tenderness.  Musculoskeletal: She exhibits no edema.  Lymphadenopathy:    She has no cervical adenopathy.  Neurological: She is alert. Coordination normal.  Skin: Skin is warm and dry. Capillary refill takes less than 2 seconds. No rash noted. She is not diaphoretic. No erythema. No pallor.  Psychiatric: She has a normal mood and affect. Her behavior is normal.  Nursing note and vitals reviewed.    ED Treatments / Results  Labs (all labs ordered are listed, but only abnormal results are displayed) Labs Reviewed - No data to display  EKG  EKG Interpretation None       Radiology No results found.  Procedures Procedures (including critical care time)  Medications Ordered in ED Medications - No data to display   Initial Impression / Assessment and Plan / ED Course  I have reviewed the triage vital signs and the nursing  notes.  Pertinent labs & imaging results that were available during my care of the patient were reviewed by me and considered in my medical decision making (see chart for details).  Clinical Course    This is a 36 y.o. Female who presents to the ED complaining of URI symptoms for about 9 days. Patient reports symptoms of sneezing, nasal congestion, postnasal drip, cough, and ear pressure for about the past 9 days. She denies any fevers. No sore throat. No shortness of breath.  On exam the patient is afebrile nontoxic appearing. Lungs clear auscultation bilaterally. Boggy nasal turbinates bilaterally. Rhinorrhea present. Mild middle ear effusion bilaterally. Patient with upper respiratory infection. I see no need for chest x-ray at this time as patient is afebrile and lungs are clear to auscultation bilaterally. Will discharge with Flonase, Zyrtec and Tessalon Perles. I discussed strict and specific return precautions. I advised the patient to follow-up with their primary care provider this week. I advised the patient to return to the emergency department with new or worsening symptoms or new concerns. The patient verbalized understanding and agreement with plan.    Final Clinical Impressions(s) / ED Diagnoses   Final diagnoses:  Viral URI with cough    New Prescriptions New Prescriptions   BENZONATATE (TESSALON) 100 MG CAPSULE    Take 1 capsule (100 mg total) by mouth 3 (three) times daily as needed for cough.   CETIRIZINE (ZYRTEC) 1 MG/ML SYRUP    Take 10 mLs (10 mg total) by mouth daily.   FLUTICASONE (FLONASE) 50 MCG/ACT NASAL SPRAY    Place 2 sprays into both nostrils daily.     Everlene FarrierWilliam Buena Boehm, PA-C 05/21/16 [id number]    Maia PlanJoshua G Long, MD 05/22/16 1022

## 2016-05-21 NOTE — ED Triage Notes (Signed)
Non-productive cough over 10 days. Congestion and ear pain continue. Taking OTC meds with some relief

## 2016-06-09 ENCOUNTER — Emergency Department (HOSPITAL_BASED_OUTPATIENT_CLINIC_OR_DEPARTMENT_OTHER)
Admission: EM | Admit: 2016-06-09 | Discharge: 2016-06-09 | Disposition: A | Discharge status: Home / Self Care | Payer: Self-pay | Attending: Emergency Medicine | Admitting: Emergency Medicine

## 2016-06-09 ENCOUNTER — Encounter (HOSPITAL_BASED_OUTPATIENT_CLINIC_OR_DEPARTMENT_OTHER): Payer: Self-pay | Admitting: Emergency Medicine

## 2016-06-09 DIAGNOSIS — R0981 Nasal congestion: Secondary | ICD-10-CM | POA: Insufficient documentation

## 2016-06-09 DIAGNOSIS — F17210 Nicotine dependence, cigarettes, uncomplicated: Secondary | ICD-10-CM | POA: Insufficient documentation

## 2016-06-09 DIAGNOSIS — R05 Cough: Secondary | ICD-10-CM | POA: Insufficient documentation

## 2016-06-09 DIAGNOSIS — H9203 Otalgia, bilateral: Secondary | ICD-10-CM | POA: Insufficient documentation

## 2016-06-09 MED ORDER — DEXAMETHASONE SODIUM PHOSPHATE 10 MG/ML IJ SOLN
10.0000 mg | Freq: Once | INTRAMUSCULAR | Status: AC
  Administered 2016-06-09: 10 mg via INTRAMUSCULAR
  Filled 2016-06-09: qty 1

## 2016-06-09 NOTE — ED Provider Notes (Signed)
[facility]   CSN: [id number] Arrival date & time: 06/09/16  1032     History   Chief Complaint Chief Complaint  Patient presents with  . Otalgia    HPI Danielle Potter is a 36 y.o. female.  The history is provided by the patient and medical records. No language interpreter was used.    Danielle Potter is a 36 y.o. female  who presents to the Emergency Department complaining of bilateral ear pain and sinus pressure x one month. Patient states that she came to ER at onset of symptoms and received cetirizine, cough medication and Flonase. She states that her sinus pressure and cough have improved, but her ear pain has not. She describes the pain as a pressure/ache. She often feels like she is "in a tunnel" with her hearing. She has tried Mucinex with little relief. She endorses associated sore throat and drainage. No fever, shortness of breath, difficulty swallowing, neck pain.   Past Medical History:  Diagnosis Date  . Constipation   . 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 Other and unspecified ovarian cyst 05/16/2013  . Pelvic pain in female 05/16/2013  . 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 Postoperative state 05/16/2013    Past Surgical History:  Procedure Laterality Date  . APPENDECTOMY    . LAPAROTOMY N/A 05/16/2013   Procedure: LAPAROTOMY;  Surgeon: Willodean Rosenthalarolyn Harraway-Smith, MD;  Location: [facility];  Service: Gynecology;  Laterality: N/A;  . SALPINGOOPHORECTOMY Left 05/16/2013   Procedure: SALPINGO OOPHORECTOMY;  Surgeon: Willodean Rosenthalarolyn Harraway-Smith, MD;  Location: [facility];  Service: Gynecology;  Laterality: Left;    OB History    No data available       Home Medications    Prior to Admission medications   Medication Sig Start Date End Date Taking? Authorizing Provider  benzonatate (TESSALON) 100 MG  capsule Take 1 capsule (100 mg total) by mouth 3 (three) times daily as needed for cough. 05/21/16   Everlene FarrierWilliam Dansie, PA-C  cetirizine (ZYRTEC) 1 MG/ML syrup Take 10 mLs (10 mg total) by mouth daily. 05/21/16   Everlene FarrierWilliam Dansie, PA-C  fluticasone (FLONASE) 50 MCG/ACT nasal spray Place 2 sprays into both nostrils daily. 05/21/16   Everlene FarrierWilliam Dansie, PA-C  ibuprofen (ADVIL,MOTRIN) 100 MG/5ML suspension Take 30 mLs (600 mg total) by mouth every 6 (six) hours as needed. 05/17/13   Willodean Rosenthalarolyn Harraway-Smith, MD  oxyCODONE-acetaminophen (PERCOCET/ROXICET) 5-325 MG per tablet Take 1-2 tablets by mouth every 6 (six) hours as needed. 05/17/13   Willodean Rosenthalarolyn Harraway-Smith, MD  polyethylene glycol powder (GLYCOLAX/MIRALAX) powder Take 17 g by mouth daily. 02/21/13   Arthor CaptainAbigail Harris, PA-C    Family History History reviewed. No pertinent family history.  Social History Social History  Substance Use Topics  . Smoking status: Current Every Day Smoker    Packs/day: 0.50    Years: 10.00    Types: Cigarettes  . Smokeless tobacco: Never Used  . Alcohol use Yes     Comment: occasional     Allergies   Patient has no known allergies.   Review of Systems Review of Systems  Constitutional: Negative for fever.  HENT: Positive for congestion, ear pain and sinus pressure.   Respiratory: Positive for cough (Resolving). Negative for shortness of breath.   Cardiovascular: Negative for chest pain.     Physical Exam Updated Vital Signs BP  118/70 (BP Location: Right Arm)   Pulse 81   Temp 98.4 F (36.9 C) (Oral)   Resp 18   Ht 5' (1.524 m)   Wt 59.4 kg   LMP 05/10/2016   SpO2 99%   BMI 25.58 kg/m   Physical Exam  Constitutional: She is oriented to person, place, and time. She appears well-developed and well-nourished. No distress.  HENT:  Head: Normocephalic and atraumatic.  + PND. No focal areas of sinus tenderness. Bilateral TMs without erythema or bulging, but do have mild amount of fluid.   Neck: Normal range  of motion. Neck supple.  Cardiovascular: Normal rate, regular rhythm and normal heart sounds.   Pulmonary/Chest: Effort normal.  Lungs are clear to auscultation bilaterally - no w/r/r  Abdominal: Soft. She exhibits no distension. There is no tenderness.  Musculoskeletal: Normal range of motion.  Neurological: She is alert and oriented to person, place, and time.  Skin: Skin is warm and dry. She is not diaphoretic.  Nursing note and vitals reviewed.    ED Treatments / Results  Labs (all labs ordered are listed, but only abnormal results are displayed) Labs Reviewed - No data to display  EKG  EKG Interpretation None       Radiology No results found.  Procedures Procedures (including critical care time)  Medications Ordered in ED Medications  dexamethasone (DECADRON) injection 10 mg (10 mg Intramuscular Given 06/09/16 1220)     Initial Impression / Assessment and Plan / ED Course  I have reviewed the triage vital signs and the nursing notes.  Pertinent labs & imaging results that were available during my care of the patient were reviewed by me and considered in my medical decision making (see chart for details).    Danielle Potter is a 36 y.o. female who presents to ED for sinus pressure and bilateral ear pain x 1 month. On exam, patient is afebrile, non-toxic appearing with no focal areas of sinus pressure. TM's are not erythematous or bulging. Both have mild amount of fluid. She did take Cetirizine earlier in the month with some improvement, but thought this was for her nasal congestion, therefore discontinued it and that improved. Will place back on cetirizine. If symptoms do not improve, follow up with PCP or ENT referral given. Reasons to return to the ED discussed and all questions answered.   Final Clinical Impressions(s) / ED Diagnoses   Final diagnoses:  Otalgia of both ears    New Prescriptions New Prescriptions   No medications on file     [facility]  Ward, PA-C 06/09/16 1232    Alvira Monday, MD 06/16/16 [phone]

## 2016-06-09 NOTE — ED Triage Notes (Signed)
Patient states that she was here around christmas for the same. Reports that her ears are achey and it is going down her throat. Reports that her ears are not getting any better.

## 2016-06-09 NOTE — Discharge Instructions (Addendum)
Continue Zyrtec. Increase hydration. If symptoms are not improving by Friday, please call the ENT listed to schedule a follow-up appointment. Return to ER for new or worsening symptoms, any additional concerns.

## 2016-06-09 NOTE — ED Notes (Signed)
ED Provider at bedside. 

## 2018-04-29 ENCOUNTER — Emergency Department (HOSPITAL_BASED_OUTPATIENT_CLINIC_OR_DEPARTMENT_OTHER): Payer: Self-pay

## 2018-04-29 ENCOUNTER — Emergency Department (HOSPITAL_BASED_OUTPATIENT_CLINIC_OR_DEPARTMENT_OTHER)
Admission: EM | Admit: 2018-04-29 | Discharge: 2018-04-29 | Disposition: A | Discharge status: Home / Self Care | Payer: Self-pay | Attending: Emergency Medicine | Admitting: Emergency Medicine

## 2018-04-29 ENCOUNTER — Other Ambulatory Visit: Payer: Self-pay

## 2018-04-29 ENCOUNTER — Encounter (HOSPITAL_BASED_OUTPATIENT_CLINIC_OR_DEPARTMENT_OTHER): Payer: Self-pay | Admitting: Emergency Medicine

## 2018-04-29 DIAGNOSIS — S32009A Unspecified fracture of unspecified lumbar vertebra, initial encounter for closed fracture: Secondary | ICD-10-CM | POA: Insufficient documentation

## 2018-04-29 DIAGNOSIS — W001XXA Fall from stairs and steps due to ice and snow, initial encounter: Secondary | ICD-10-CM | POA: Insufficient documentation

## 2018-04-29 DIAGNOSIS — Z79899 Other long term (current) drug therapy: Secondary | ICD-10-CM | POA: Insufficient documentation

## 2018-04-29 DIAGNOSIS — Y929 Unspecified place or not applicable: Secondary | ICD-10-CM | POA: Insufficient documentation

## 2018-04-29 DIAGNOSIS — Y9389 Activity, other specified: Secondary | ICD-10-CM | POA: Insufficient documentation

## 2018-04-29 DIAGNOSIS — Y998 Other external cause status: Secondary | ICD-10-CM | POA: Insufficient documentation

## 2018-04-29 DIAGNOSIS — F17210 Nicotine dependence, cigarettes, uncomplicated: Secondary | ICD-10-CM | POA: Insufficient documentation

## 2018-04-29 LAB — PREGNANCY, URINE: Preg Test, Ur: NEGATIVE

## 2018-04-29 MED ORDER — ONDANSETRON HCL 4 MG/2ML IJ SOLN
4.0000 mg | Freq: Once | INTRAMUSCULAR | Status: AC
  Administered 2018-04-29: 4 mg via INTRAVENOUS
  Filled 2018-04-29: qty 2

## 2018-04-29 MED ORDER — HYDROMORPHONE HCL 1 MG/ML IJ SOLN
1.0000 mg | Freq: Once | INTRAMUSCULAR | Status: AC
  Administered 2018-04-29: 1 mg via INTRAVENOUS
  Filled 2018-04-29: qty 1

## 2018-04-29 MED ORDER — OXYCODONE-ACETAMINOPHEN 5-325 MG PO TABS: 1.0000 | ORAL_TABLET | ORAL | 0 refills | Status: DC | PRN

## 2018-04-29 MED ORDER — IBUPROFEN 600 MG PO TABS: 600.0000 mg | ORAL_TABLET | Freq: Three times a day (TID) | ORAL | 0 refills | Status: AC | PRN

## 2018-04-29 MED ORDER — METHOCARBAMOL 500 MG PO TABS: 500.0000 mg | ORAL_TABLET | Freq: Three times a day (TID) | ORAL | 0 refills | Status: DC | PRN

## 2018-04-29 MED FILL — IBUPROFEN 600 MG TABLET: 600 | 5 days supply | Qty: 15 | Fill #0

## 2018-04-29 MED FILL — OXYCODONE-ACETAMINOPHEN 5-3: 5-325 | 2 days supply | Qty: 12 | Fill #0

## 2018-04-29 MED FILL — METHOCARBAMOL 500 MG TABLET: 500 | 4 days supply | Qty: 12 | Fill #0

## 2018-04-29 NOTE — ED Triage Notes (Signed)
Pt came in complaining of falling down steps because of the ice. She slipped landing on her back and slid down 3 steps. Pt c/o of lower back pain on the right side that is sending burning pain down the right leg. Pt needed assistance to transfer from wheelchair to bed. Denies LOC

## 2018-04-29 NOTE — ED Provider Notes (Signed)
[facility] Provider Note: Lowella DellJ. Lane Mikhail Hallenbeck, MD, FACEP  CSN: [id number] MRN: [id number] ARRIVAL: 04/29/18 at 0543 ROOM: [facility]01   CHIEF COMPLAINT  Fall   HISTORY OF PRESENT ILLNESS  04/29/18 5:54 AM Danielle Potter is a 37 y.o. female who slipped on ice and fell down 3 steps just prior to arrival.  She is having right-sided lower back pain which she rates as a 10 out of 10.  It is sending pain down back of her right leg.  Pain is worse with attempted movement of her lower back or right leg.  There is no associated numbness or weakness.  She denies other injury.   Past Medical History:  Diagnosis Date  . Constipation   . 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 APPENDECTOMY    . LAPAROTOMY N/A 05/16/2013   Procedure: LAPAROTOMY;  Surgeon: Willodean Rosenthalarolyn Harraway-Smith, MD;  Location: [facility];  Service: Gynecology;  Laterality: N/A;  . SALPINGOOPHORECTOMY Left 05/16/2013   Procedure: SALPINGO OOPHORECTOMY;  Surgeon: Willodean Rosenthalarolyn Harraway-Smith, MD;  Location: [facility];  Service: Gynecology;  Laterality: Left;    No family history on file.  Social History   Tobacco Use  . Smoking status: Current Every Day Smoker    Packs/day: 0.50    Years: 10.00    Pack years: 5.00    Types: Cigarettes  . Smokeless tobacco: Never Used  Substance Use Topics  . Alcohol use: Yes    Comment: occasional  . Drug use: No    Prior to Admission medications   Medication Sig Start Date End Date Taking? Authorizing Provider  benzonatate (TESSALON) 100 MG capsule Take 1 capsule (100 mg total) by mouth 3 (three) times daily as needed for cough. 05/21/16   Everlene Farrieransie, William, PA-C  cetirizine (ZYRTEC) 1 MG/ML syrup Take 10 mLs (10 mg total) by mouth daily. 05/21/16   Everlene Farrieransie, William, PA-C  fluticasone (FLONASE) 50 MCG/ACT nasal spray Place 2 sprays into both nostrils daily. 05/21/16   Everlene Farrieransie, William, PA-C    ibuprofen (ADVIL,MOTRIN) 600 MG tablet Take 1 tablet (600 mg total) by mouth every 8 (eight) hours as needed. 04/29/18   Azalia Bilisampos, Kevin, MD  methocarbamol (ROBAXIN) 500 MG tablet Take 1 tablet (500 mg total) by mouth every 8 (eight) hours as needed for muscle spasms. 04/29/18   Azalia Bilisampos, Kevin, MD  oxyCODONE-acetaminophen (PERCOCET/ROXICET) 5-325 MG tablet Take 1 tablet by mouth every 4 (four) hours as needed. 04/29/18   Azalia Bilisampos, Kevin, MD  polyethylene glycol powder (GLYCOLAX/MIRALAX) powder Take 17 g by mouth daily. 02/21/13   Arthor CaptainHarris, Abigail, PA-C    Allergies Patient has no known allergies.   REVIEW OF SYSTEMS  Negative except as noted here or in the History of Present Illness.   PHYSICAL EXAMINATION  Initial Vital Signs Blood pressure 129/86, pulse 65, temperature 98.2 F (36.8 C), temperature source Oral, resp. rate 20, height 5' (1.524 m), weight 62.6 kg, last menstrual period 04/29/2018, SpO2 100 %.  Examination General: Well-developed, well-nourished female in no acute distress; appearance consistent with age of record HENT: normocephalic; atraumatic Eyes: pupils equal, round and reactive to light; extraocular muscles intact Neck: supple; nontender Heart: regular rate and rhythm Lungs: clear to auscultation bilaterally Abdomen: soft; nondistended; nontender; bowel sounds present Back: No lumbar spinal tenderness; right sided lumbar paraspinal tenderness; pain on attempted movement of right hip Extremities: No deformity; full range of motion  except right hip due to pain; pulses normal Neurologic: Awake, alert and oriented; motor function intact in all extremities and symmetric; sensation intact in lower extremities and symmetric; no saddle anesthesia; no facial droop Skin: Warm and dry Psychiatric: Normal mood and affect   RESULTS  Summary of this visit's results, reviewed by myself:   EKG Interpretation  Date/Time:    Ventricular Rate:    PR Interval:    QRS Duration:    QT Interval:    QTC Calculation:   R Axis:     Text Interpretation:        Laboratory Studies: Results for orders placed or performed during the hospital encounter of 04/29/18 (from the past 24 hour(s))  Pregnancy, urine     Status: None   Collection Time: 04/29/18  6:39 AM  Result Value Ref Range   Preg Test, Ur NEGATIVE NEGATIVE   Imaging Studies: Ct Lumbar Spine Wo Contrast  Result Date: 04/29/2018 CLINICAL DATA:  Trauma with right radiculopathy. EXAM: CT LUMBAR SPINE WITHOUT CONTRAST TECHNIQUE: Multidetector CT imaging of the lumbar spine was performed without intravenous contrast administration. Multiplanar CT image reconstructions were also generated. COMPARISON:  None. FINDINGS: Segmentation: 5 lumbar type vertebral bodies Alignment: Normal Vertebrae: Nondisplaced fractures of the right L1 through L3 transverse processes. Paraspinal and other soft tissues: No evident injury. Partially covered cystic structure in the low right pelvis which could be ovarian follicle or persistence of the cystic structure seen in 2016 by CT. If the latter, where covered there has been decrease in thickness. Disc levels: No evident herniation. No visible impingement. There is a dysplastic right T12-L1 facet with spurring that is stable from 2016. IMPRESSION: Nondisplaced fractures of the right L1 through L3 transverse processes. Electronically Signed   By: Marnee Spring M.D.   On: 04/29/2018 07:13    ED COURSE and MDM  Nursing notes and initial vitals signs, including pulse oximetry, reviewed.  Vitals:   04/29/18 0550 04/29/18 0551 04/29/18 0725  BP: 129/86  113/75  Pulse: 65  76  Resp: 20  16  Temp: 98.2 F (36.8 C)    TempSrc: Oral    SpO2: 100%  91%  Weight:  62.6 kg   Height:  5' (1.524 m)     PROCEDURES    ED DIAGNOSES     ICD-10-CM   1. Closed fracture of transverse process of lumbar vertebra, initial encounter (HCC) S32.009A        Darick Fetters, MD 04/29/18 2239

## 2018-04-29 NOTE — ED Provider Notes (Signed)
7:43 AM Lumbar transverse process fracture x3.  Nondisplaced.  Ambulatory in the ER.  Discharged home with short course of pain medication muscle relaxants.  Definitive Fracture Care  Definitive fracture care was performed for the patient's Lumbar Transverse process fracture R (L1-L3). Treatment includes management of pain Fracture related discharge instructions were provided Symptomatic control measures provided to the patient    Danielle Potter, Danielle Cambridge, MD 04/29/18 [phone]

## 2020-01-22 ENCOUNTER — Emergency Department (HOSPITAL_BASED_OUTPATIENT_CLINIC_OR_DEPARTMENT_OTHER)
Admission: EM | Admit: 2020-01-22 | Discharge: 2020-01-22 | Disposition: A | Discharge status: Home / Self Care | Payer: BC Managed Care – PPO | Attending: Emergency Medicine | Admitting: Emergency Medicine

## 2020-01-22 ENCOUNTER — Other Ambulatory Visit: Payer: Self-pay

## 2020-01-22 ENCOUNTER — Emergency Department (HOSPITAL_BASED_OUTPATIENT_CLINIC_OR_DEPARTMENT_OTHER): Payer: BC Managed Care – PPO

## 2020-01-22 ENCOUNTER — Encounter (HOSPITAL_BASED_OUTPATIENT_CLINIC_OR_DEPARTMENT_OTHER): Payer: Self-pay

## 2020-01-22 DIAGNOSIS — W231XXA Caught, crushed, jammed, or pinched between stationary objects, initial encounter: Secondary | ICD-10-CM | POA: Insufficient documentation

## 2020-01-22 DIAGNOSIS — F17210 Nicotine dependence, cigarettes, uncomplicated: Secondary | ICD-10-CM | POA: Insufficient documentation

## 2020-01-22 DIAGNOSIS — S6991XA Unspecified injury of right wrist, hand and finger(s), initial encounter: Secondary | ICD-10-CM | POA: Diagnosis present

## 2020-01-22 DIAGNOSIS — Z79899 Other long term (current) drug therapy: Secondary | ICD-10-CM | POA: Diagnosis not present

## 2020-01-22 DIAGNOSIS — Y9289 Other specified places as the place of occurrence of the external cause: Secondary | ICD-10-CM | POA: Diagnosis not present

## 2020-01-22 DIAGNOSIS — S60111A Contusion of right thumb with damage to nail, initial encounter: Secondary | ICD-10-CM | POA: Diagnosis not present

## 2020-01-22 DIAGNOSIS — Y998 Other external cause status: Secondary | ICD-10-CM | POA: Diagnosis not present

## 2020-01-22 DIAGNOSIS — Y9389 Activity, other specified: Secondary | ICD-10-CM | POA: Insufficient documentation

## 2020-01-22 DIAGNOSIS — S6010XA Contusion of unspecified finger with damage to nail, initial encounter: Secondary | ICD-10-CM

## 2020-01-22 NOTE — ED Triage Notes (Signed)
Pt states she slammed right thumb in door ~7pm yesterday-NAD-steady gait

## 2020-01-22 NOTE — ED Notes (Signed)
Cautery High and Low temp placed at bedside per Dr.Knapp

## 2020-01-22 NOTE — Discharge Instructions (Addendum)
Take over-the-counter medications as needed for pain.  Continue to apply ice.  Swelling and bruising should resolve over the next week

## 2020-01-22 NOTE — ED Provider Notes (Signed)
[facility] EMERGENCY DEPARTMENT Provider Note   CSN: [id number] Arrival date & time: 01/22/20  1217     History Chief Complaint  Patient presents with  . Finger Injury    Danielle Potter is a 39 y.o. female.  HPI   Patient presented to the ED for evaluation of a finger injury.  Patient jammed her right thumb yesterday about 7 PM.  Patient states she is had bruising and swelling of her right thumb since that time.  Patient feels a lot of pressure underneath her nail she denies any numbness or weakness.  No other injuries  Past Medical History:  Diagnosis Date  . Constipation   . 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 Other and unspecified ovarian cyst 05/16/2013  . Pelvic pain in female 05/16/2013  . 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 Postoperative state 05/16/2013    Past Surgical History:  Procedure Laterality Date  . APPENDECTOMY    . LAPAROTOMY N/A 05/16/2013   Procedure: LAPAROTOMY;  Surgeon: Willodean Rosenthal, MD;  Location: [facility];  Service: Gynecology;  Laterality: N/A;  . SALPINGOOPHORECTOMY Left 05/16/2013   Procedure: SALPINGO OOPHORECTOMY;  Surgeon: Willodean Rosenthal, MD;  Location: [facility];  Service: Gynecology;  Laterality: Left;     OB History   No obstetric history on file.     No family history on file.  Social History   Tobacco Use  . Smoking status: Current Every Day Smoker    Packs/day: 0.50    Years: 10.00    Pack years: 5.00    Types: Cigarettes  . Smokeless tobacco: Never Used  Vaping Use  . Vaping Use: Never used  Substance Use Topics  . Alcohol use: Yes    Comment: occasional  . Drug use: No    Home Medications Prior to Admission medications   Medication Sig Start Date End Date Taking? Authorizing Provider  cetirizine (ZYRTEC) 1 MG/ML syrup Take 10 mLs (10 mg  total) by mouth daily. 05/21/16   Everlene Farrier, PA-C  FLUoxetine (PROZAC) 10 MG capsule Take by mouth. 01/11/20   [provider]  fluticasone (FLONASE) 50 MCG/ACT nasal spray Place 2 sprays into both nostrils daily. 05/21/16   Everlene Farrier, PA-C  ibuprofen (ADVIL,MOTRIN) 600 MG tablet Take 1 tablet (600 mg total) by mouth every 8 (eight) hours as needed. 04/29/18   Azalia Bilis, MD  phentermine (ADIPEX-P) 37.5 MG tablet Take 37.5 mg by mouth daily. 11/25/19   [provider]  polyethylene glycol powder (GLYCOLAX/MIRALAX) powder Take 17 g by mouth daily. 02/21/13   Arthor Captain, PA-C    Allergies    Patient has no known allergies.  Review of Systems   Review of Systems  All other systems reviewed and are negative.   Physical Exam Updated Vital Signs BP 136/87 (BP Location: Right Arm)   Pulse 65   Temp 98.3 F (36.8 C) (Oral)   Resp 19   Ht 1.524 m (5')   Wt 69.4 kg   LMP 01/14/2020   SpO2 100%   BMI 29.88 kg/m   Physical Exam Vitals and nursing note reviewed.  Constitutional:      General: She is not in acute distress.    Appearance: She is well-developed.  HENT:     Head: Normocephalic and atraumatic.     Right Ear: External ear normal.  Left Ear: External ear normal.  Eyes:     General: No scleral icterus.       Right eye: No discharge.        Left eye: No discharge.     Conjunctiva/sclera: Conjunctivae normal.  Neck:     Trachea: No tracheal deviation.  Cardiovascular:     Rate and Rhythm: Normal rate.  Pulmonary:     Effort: Pulmonary effort is normal. No respiratory distress.     Breath sounds: No stridor.  Abdominal:     General: There is no distension.  Musculoskeletal:        General: No swelling or deformity.     Cervical back: Neck supple.  Skin:    General: Skin is warm and dry.     Findings: No rash.  Neurological:     Mental Status: She is alert.     Cranial Nerves: Cranial nerve deficit: no gross deficits.     ED  Results / Procedures / Treatments   Labs (all labs ordered are listed, but only abnormal results are displayed) Labs Reviewed - No data to display  EKG None  Radiology DG Finger Thumb Right  Result Date: 01/22/2020 CLINICAL DATA:  Right thumb blunt injury. EXAM: RIGHT THUMB 2+V COMPARISON:  None. FINDINGS: There is no evidence of fracture or dislocation. There is no evidence of arthropathy or other focal bone abnormality. Soft tissues are unremarkable. IMPRESSION: Negative. Electronically Signed   By: Ted Mcalpine M.D.   On: 01/22/2020 13:15    Procedures Procedures (including critical care time)  Medications Ordered in ED Medications - No data to display  ED Course  I have reviewed the triage vital signs and the nursing notes.  Pertinent labs & imaging results that were available during my care of the patient were reviewed by me and considered in my medical decision making (see chart for details).    MDM Rules/Calculators/A&P                          Patient presented to the ED for evaluation of a finger injury.  Patient had findings to suggest subungual hematoma but she did have gel coat nail polish on that did limit our evaluation.  I was able to see subungual hematoma at the base where there was no nail polish.  Trephination procedure performed by PA Harris.  Patient discharged in stable condition Final Clinical Impression(s) / ED Diagnoses Final diagnoses:  Subungual hematoma of digit of hand, initial encounter    Rx / DC Orders ED Discharge Orders    None       Linwood Dibbles, MD 01/22/20 1524

## 2022-03-17 IMAGING — DX DG FINGER THUMB 2+V*R*
3 series · 3 of 3 positions shown · non-contrast
Comparison: None.

CLINICAL DATA: Right thumb blunt injury.

EXAM:
RIGHT THUMB 2+V

[finger ap]
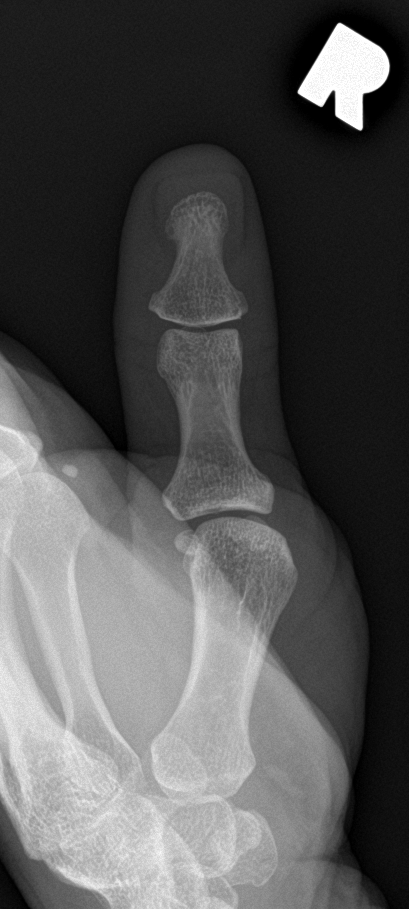

[finger obl]
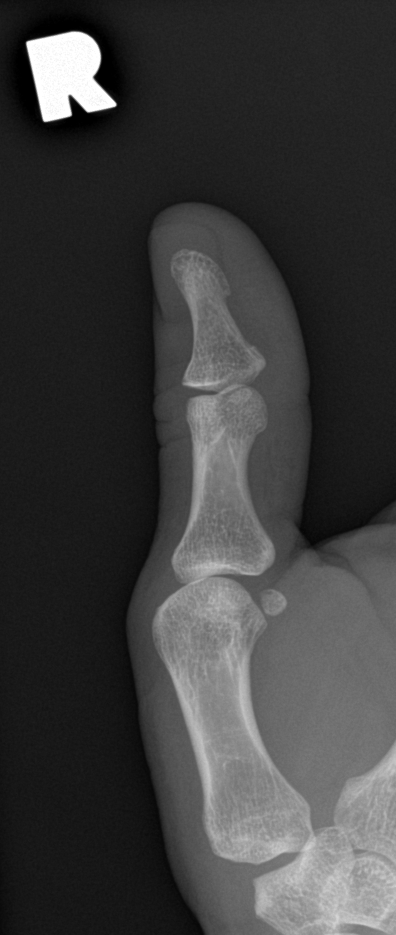

[finger lat]
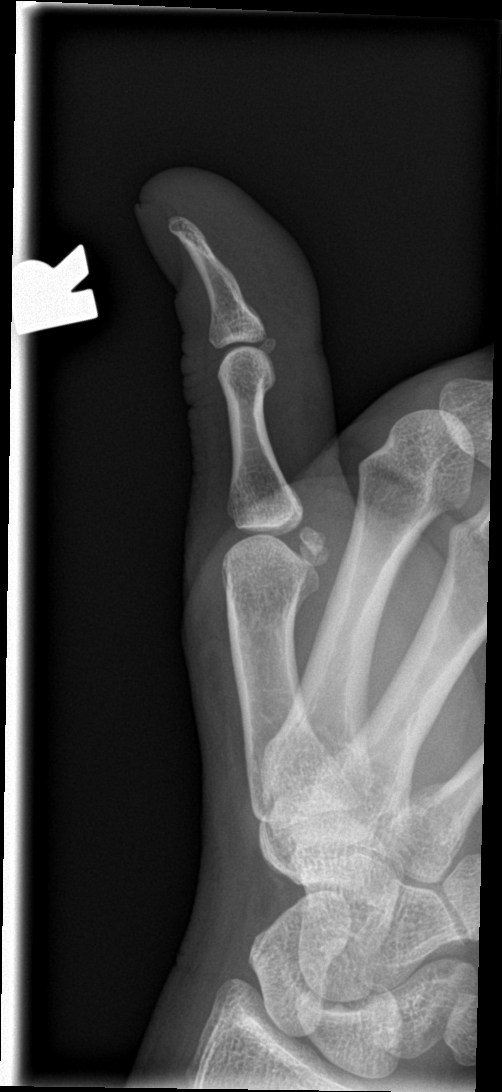

[3 of 3 positions shown; findings below may reference images not displayed]

FINDINGS: There is no evidence of fracture or dislocation. There is no
evidence of arthropathy or other focal bone abnormality. Soft
tissues are unremarkable.
IMPRESSION: Negative.
